# Patient Record
Sex: Female | Born: 1995 | Race: Black or African American | Hispanic: No | Marital: Single | Smoking: Former smoker
Health system: Southern US, Community
[De-identification: ages and names within clinical notes are randomized; demographics above are authoritative.]

## PROBLEM LIST (undated history)

## (undated) DIAGNOSIS — H539 Unspecified visual disturbance: Secondary | ICD-10-CM

## (undated) DIAGNOSIS — F32A Depression, unspecified: Secondary | ICD-10-CM

## (undated) DIAGNOSIS — H6093 Unspecified otitis externa, bilateral: Secondary | ICD-10-CM

## (undated) DIAGNOSIS — F329 Major depressive disorder, single episode, unspecified: Secondary | ICD-10-CM

## (undated) DIAGNOSIS — A749 Chlamydial infection, unspecified: Secondary | ICD-10-CM

## (undated) DIAGNOSIS — J353 Hypertrophy of tonsils with hypertrophy of adenoids: Secondary | ICD-10-CM

## (undated) DIAGNOSIS — F909 Attention-deficit hyperactivity disorder, unspecified type: Secondary | ICD-10-CM

## (undated) DIAGNOSIS — E669 Obesity, unspecified: Secondary | ICD-10-CM

## (undated) DIAGNOSIS — R0683 Snoring: Secondary | ICD-10-CM

## (undated) HISTORY — PX: EYE SURGERY: SHX253

## (undated) HISTORY — PX: ORTHOPEDIC SURGERY: SHX850

## (undated) HISTORY — PX: OTHER SURGICAL HISTORY: SHX169

---
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
## 2016-01-06 ENCOUNTER — Telehealth: Payer: Self-pay | Admitting: Nurse Practitioner

## 2016-01-06 DIAGNOSIS — F988 Other specified behavioral and emotional disorders with onset usually occurring in childhood and adolescence: Secondary | ICD-10-CM

## 2016-01-06 MED ORDER — LISDEXAMFETAMINE DIMESYLATE 60 MG PO CAPS: 60.0000 mg | ORAL_CAPSULE | ORAL | Status: DC

## 2016-01-06 NOTE — Telephone Encounter (Signed)
vyvanse rx ready for pick up  

## 2016-01-06 NOTE — Telephone Encounter (Signed)
Patient aware rx ready to be picked up 

## 2016-02-05 ENCOUNTER — Other Ambulatory Visit: Payer: Self-pay | Admitting: Nurse Practitioner

## 2016-02-05 DIAGNOSIS — F988 Other specified behavioral and emotional disorders with onset usually occurring in childhood and adolescence: Secondary | ICD-10-CM

## 2016-02-06 NOTE — Telephone Encounter (Signed)
Last filled 01/06/16, last seen 10/03/15.Rx will print

## 2016-02-06 NOTE — Telephone Encounter (Signed)
Patient NTBS for follow up and lab work  

## 2016-02-06 NOTE — Telephone Encounter (Signed)
Notified will NTBS

## 2016-02-26 ENCOUNTER — Ambulatory Visit (INDEPENDENT_AMBULATORY_CARE_PROVIDER_SITE_OTHER): Payer: Medicaid Other | Admitting: Nurse Practitioner

## 2016-02-26 ENCOUNTER — Encounter: Payer: Self-pay | Admitting: Nurse Practitioner

## 2016-02-26 DIAGNOSIS — F900 Attention-deficit hyperactivity disorder, predominantly inattentive type: Secondary | ICD-10-CM

## 2016-02-26 DIAGNOSIS — F988 Other specified behavioral and emotional disorders with onset usually occurring in childhood and adolescence: Secondary | ICD-10-CM

## 2016-02-26 MED ORDER — LISDEXAMFETAMINE DIMESYLATE 60 MG PO CAPS: 60.0000 mg | ORAL_CAPSULE | ORAL | 0 refills | Status: DC

## 2016-02-26 NOTE — Progress Notes (Signed)
   Subjective:    Patient ID: Alexandria Lara, female    DOB: 1996/03/19, 20 y.o.   MRN: [id number]  HPI Patient brought in today by self for follow up of ADD. Currently taking currently on vyvanse 60mg  daily. Behavior- no issues Grades- good at end of school year last year. Is just now starting college at [employer]. Medication side effects- none Weight loss- none Sleeping habits- good Any concerns- none today     Review of Systems  Constitutional: Negative.   HENT: Negative.   Respiratory: Negative.   Cardiovascular: Negative.   Genitourinary: Negative.   Neurological: Negative.   Psychiatric/Behavioral: Negative.   All other systems reviewed and are negative.      Objective:   Physical Exam  Constitutional: She is oriented to person, place, and time. She appears well-developed and well-nourished.  Cardiovascular: Normal rate, regular rhythm and normal heart sounds.   Musculoskeletal: Normal range of motion.  Neurological: She is alert and oriented to person, place, and time.  Skin: Skin is warm.  Psychiatric: She has a normal mood and affect. Her behavior is normal. Judgment and thought content normal.    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 lisdexamfetamine (VYVANSE) 60 MG capsule    Sig: Take 1 capsule (60 mg total) by mouth every morning.    Dispense:  30 capsule    Refill:  0    DO NOT FILL TILL 03/27/16    Order Specific Question:   Supervising Provider    Answer:   Rex KrasVINCENT, CAROL L [[id number]]  . lisdexamfetamine (VYVANSE) 60 MG capsule    Sig: Take 1 capsule (60 mg total) by mouth every morning.    Dispense:  30 capsule    Refill:  0    DO NOT FILL TILL 04/25/16    Order Specific Question:   Supervising Provider    Answer:   Rex KrasVINCENT, CAROL L [[id number]]  .  lisdexamfetamine (VYVANSE) 60 MG capsule    Sig: Take 1 capsule (60 mg total) by mouth every morning.    Dispense:  30 capsule    Refill:  0    Order Specific Question:   Supervising Provider    Answer:   Ala BentVINCENT, CAROL L [[id number]]   Stress management encouraged Follow up in 3 months  Mary-Margaret Daphine DeutscherMartin, FNP

## 2016-02-26 NOTE — Patient Instructions (Signed)
Stress and Stress Management Stress is a normal reaction to life events. It is what you feel when life demands more than you are used to or more than you can handle. Some stress can be useful. For example, the stress reaction can help you catch the last bus of the day, study for a test, or meet a deadline at work. But stress that occurs too often or for too long can cause problems. It can affect your emotional health and interfere with relationships and normal daily activities. Too much stress can weaken your immune system and increase your risk for physical illness. If you already have a medical problem, stress can make it worse. CAUSES  All sorts of life events may cause stress. An event that causes stress for one person may not be stressful for another person. Major life events commonly cause stress. These may be positive or negative. Examples include losing your job, moving into a new home, getting married, having a baby, or losing a loved one. Less obvious life events may also cause stress, especially if they occur day after day or in combination. Examples include working long hours, driving in traffic, caring for children, being in debt, or being in a difficult relationship. SIGNS AND SYMPTOMS Stress may cause emotional symptoms including, the following:  Anxiety. This is feeling worried, afraid, on edge, overwhelmed, or out of control.  Anger. This is feeling irritated or impatient.  Depression. This is feeling sad, down, helpless, or guilty.  Difficulty focusing, remembering, or making decisions. Stress may cause physical symptoms, including the following:   Aches and pains. These may affect your head, neck, back, stomach, or other areas of your body.  Tight muscles or clenched jaw.  Low energy or trouble sleeping. Stress may cause unhealthy behaviors, including the following:   Eating to feel better (overeating) or skipping meals.  Sleeping too little, too much, or both.  Working  too much or putting off tasks (procrastination).  Smoking, drinking alcohol, or using drugs to feel better. DIAGNOSIS  Stress is diagnosed through an assessment by your health care provider. Your health care provider will ask questions about your symptoms and any stressful life events.Your health care provider will also ask about your medical history and may order blood tests or other tests. Certain medical conditions and medicine can cause physical symptoms similar to stress. Mental illness can cause emotional symptoms and unhealthy behaviors similar to stress. Your health care provider may refer you to a mental health professional for further evaluation.  TREATMENT  Stress management is the recommended treatment for stress.The goals of stress management are reducing stressful life events and coping with stress in healthy ways.  Techniques for reducing stressful life events include the following:  Stress identification. Self-monitor for stress and identify what causes stress for you. These skills may help you to avoid some stressful events.  Time management. Set your priorities, keep a calendar of events, and learn to say "no." These tools can help you avoid making too many commitments. Techniques for coping with stress include the following:  Rethinking the problem. Try to think realistically about stressful events rather than ignoring them or overreacting. Try to find the positives in a stressful situation rather than focusing on the negatives.  Exercise. Physical exercise can release both physical and emotional tension. The key is to find a form of exercise you enjoy and do it regularly.  Relaxation techniques. These relax the body and mind. Examples include yoga, meditation, tai chi, biofeedback, deep  breathing, progressive muscle relaxation, listening to music, being out in nature, journaling, and other hobbies. Again, the key is to find one or more that you enjoy and can do  regularly.  Healthy lifestyle. Eat a balanced diet, get plenty of sleep, and do not smoke. Avoid using alcohol or drugs to relax.  Strong support network. Spend time with family, friends, or other people you enjoy being around.Express your feelings and talk things over with someone you trust. Counseling or talktherapy with a mental health professional may be helpful if you are having difficulty managing stress on your own. Medicine is typically not recommended for the treatment of stress.Talk to your health care provider if you think you need medicine for symptoms of stress. HOME CARE INSTRUCTIONS  Keep all follow-up visits as directed by your health care provider.  Take all medicines as directed by your health care provider. SEEK MEDICAL CARE IF:  Your symptoms get worse or you start having new symptoms.  You feel overwhelmed by your problems and can no longer manage them on your own. SEEK IMMEDIATE MEDICAL CARE IF:  You feel like hurting yourself or someone else.   This information is not intended to replace advice given to you by your health care provider. Make sure you discuss any questions you have with your health care provider.   Document Released: 12/08/2000 Document Revised: 07/05/2014 Document Reviewed: 02/06/2013 Elsevier Interactive Patient Education 2016 Elsevier Inc.  

## 2016-04-28 ENCOUNTER — Ambulatory Visit: Payer: Medicaid Other | Admitting: Family Medicine

## 2016-04-28 ENCOUNTER — Ambulatory Visit: Payer: Medicaid Other | Admitting: Pediatrics

## 2016-04-29 ENCOUNTER — Ambulatory Visit (INDEPENDENT_AMBULATORY_CARE_PROVIDER_SITE_OTHER): Payer: Medicaid Other | Admitting: Family

## 2016-04-29 ENCOUNTER — Encounter: Payer: Self-pay | Admitting: Family

## 2016-04-29 VITALS — BP 138/103 | HR 105 | Temp 98.2°F | Ht 62.0 in | Wt 285.6 lb

## 2016-04-29 DIAGNOSIS — J069 Acute upper respiratory infection, unspecified: Secondary | ICD-10-CM | POA: Diagnosis not present

## 2016-04-29 DIAGNOSIS — F17200 Nicotine dependence, unspecified, uncomplicated: Secondary | ICD-10-CM

## 2016-04-29 MED ORDER — FLUTICASONE PROPIONATE 50 MCG/ACT NA SUSP: 2.0000 | Freq: Every day | NASAL | 6 refills | Status: DC

## 2016-04-29 MED ORDER — BENZONATATE 200 MG PO CAPS: 200.0000 mg | ORAL_CAPSULE | Freq: Three times a day (TID) | ORAL | 1 refills | Status: DC | PRN

## 2016-04-29 NOTE — Progress Notes (Signed)
   Subjective:    Patient ID: Alexandria Lara, female    DOB: 10-Nov-1995, 20 y.o.   MRN: [id number]  Cough  This is a new problem. The current episode started in the past 7 days. The problem has been gradually worsening. The problem occurs every few minutes. The cough is productive of sputum. Associated symptoms include chills, ear pain (right), headaches, myalgias, nasal congestion, postnasal drip, rhinorrhea, a sore throat, shortness of breath and wheezing. Pertinent negatives include no fever. The symptoms are aggravated by lying down. She has tried rest and OTC cough suppressant for the symptoms. The treatment provided mild relief. There is no history of asthma or COPD.      Review of Systems  Constitutional: Positive for chills. Negative for fever.  HENT: Positive for ear pain (right), postnasal drip, rhinorrhea and sore throat.   Respiratory: Positive for cough, shortness of breath and wheezing.   Musculoskeletal: Positive for myalgias.  Neurological: Positive for headaches.  All other systems reviewed and are negative.      Objective:   Physical Exam  Constitutional: She is oriented to person, place, and time. She appears well-developed and well-nourished. No distress.  HENT:  Head: Normocephalic and atraumatic.  Right Ear: External ear normal. Tympanic membrane is erythematous.  Left Ear: External ear normal.  Nose: Mucosal edema and rhinorrhea present.  Mouth/Throat: Posterior oropharyngeal erythema present.  Eyes: Pupils are equal, round, and reactive to light.  Neck: Normal range of motion. Neck supple. No thyromegaly present.  Cardiovascular: Normal rate, regular rhythm, normal heart sounds and intact distal pulses.   No murmur heard. Pulmonary/Chest: Effort normal and breath sounds normal. No respiratory distress. She has no wheezes.  Abdominal: Soft. Bowel sounds are normal. She exhibits no distension. There is no tenderness.  Musculoskeletal: Normal range of motion. She  exhibits no edema or tenderness.  Neurological: She is alert and oriented to person, place, and time. She has normal reflexes. No cranial nerve deficit.  Skin: Skin is warm and dry.  Psychiatric: She has a normal mood and affect. Her behavior is normal. Judgment and thought content normal.  Vitals reviewed.     BP (!) 138/103   Pulse (!) 105   Temp 98.2 F (36.8 C) (Oral)   Ht 5\' 2"  (1.575 m)   Wt 285 lb 9.6 oz (129.5 kg)   BMI 52.24 kg/m      Assessment & Plan:  1. Current smoker  2. Acute upper respiratory infection -- Take meds as prescribed - Use a cool mist humidifier  -Use saline nose sprays frequently -Saline irrigations of the nose can be very helpful if done frequently.  * 4X daily for 1 week*  * Use of a nettie pot can be helpful with this. Follow directions with this* -Force fluids -For any cough or congestion  Use plain Mucinex- regular strength or max strength is fine   * Children- consult with Pharmacist for dosing -For fever or aces or pains- take tylenol or ibuprofen appropriate for age and weight.  * for fevers greater than 101 orally you may alternate ibuprofen and tylenol every  3 hours. -Throat lozenges if help - fluticasone (FLONASE) 50 MCG/ACT nasal spray; Place 2 sprays into both nostrils daily.  Dispense: 16 g; Refill: 6 - benzonatate (TESSALON) 200 MG capsule; Take 1 capsule (200 mg total) by mouth 3 (three) times daily as needed.  Dispense: 30 capsule; Refill: 1  Jannifer Rodneyhristy Hawks, FNP

## 2016-04-29 NOTE — Patient Instructions (Signed)
Upper Respiratory Infection, Adult Most upper respiratory infections (URIs) are a viral infection of the air passages leading to the lungs. A URI affects the nose, throat, and upper air passages. The most common type of URI is nasopharyngitis and is typically referred to as "the common cold." URIs run their course and usually go away on their own. Most of the time, a URI does not require medical attention, but sometimes a bacterial infection in the upper airways can follow a viral infection. This is called a secondary infection. Sinus and middle ear infections are common types of secondary upper respiratory infections. Bacterial pneumonia can also complicate a URI. A URI can worsen asthma and chronic obstructive pulmonary disease (COPD). Sometimes, these complications can require emergency medical care and may be life threatening.  CAUSES Almost all URIs are caused by viruses. A virus is a type of germ and can spread from one person to another.  RISKS FACTORS You may be at risk for a URI if:   You smoke.   You have chronic heart or lung disease.  You have a weakened defense (immune) system.   You are very young or very old.   You have nasal allergies or asthma.  You work in crowded or poorly ventilated areas.  You work in health care facilities or schools. SIGNS AND SYMPTOMS  Symptoms typically develop 2-3 days after you come in contact with a cold virus. Most viral URIs last 7-10 days. However, viral URIs from the influenza virus (flu virus) can last 14-18 days and are typically more severe. Symptoms may include:   Runny or stuffy (congested) nose.   Sneezing.   Cough.   Sore throat.   Headache.   Fatigue.   Fever.   Loss of appetite.   Pain in your forehead, behind your eyes, and over your cheekbones (sinus pain).  Muscle aches.  DIAGNOSIS  Your health care provider may diagnose a URI by:  Physical exam.  Tests to check that your symptoms are not due to  another condition such as:  Strep throat.  Sinusitis.  Pneumonia.  Asthma. TREATMENT  A URI goes away on its own with time. It cannot be cured with medicines, but medicines may be prescribed or recommended to relieve symptoms. Medicines may help:  Reduce your fever.  Reduce your cough.  Relieve nasal congestion. HOME CARE INSTRUCTIONS   Take medicines only as directed by your health care provider.   Gargle warm saltwater or take cough drops to comfort your throat as directed by your health care provider.  Use a warm mist humidifier or inhale steam from a shower to increase air moisture. This may make it easier to breathe.  Drink enough fluid to keep your urine clear or pale yellow.   Eat soups and other clear broths and maintain good nutrition.   Rest as needed.   Return to work when your temperature has returned to normal or as your health care provider advises. You may need to stay home longer to avoid infecting others. You can also use a face mask and careful hand washing to prevent spread of the virus.  Increase the usage of your inhaler if you have asthma.   Do not use any tobacco products, including cigarettes, chewing tobacco, or electronic cigarettes. If you need help quitting, ask your health care provider. PREVENTION  The best way to protect yourself from getting a cold is to practice good hygiene.   Avoid oral or hand contact with people with cold   symptoms.   Wash your hands often if contact occurs.  There is no clear evidence that vitamin C, vitamin E, echinacea, or exercise reduces the chance of developing a cold. However, it is always recommended to get plenty of rest, exercise, and practice good nutrition.  SEEK MEDICAL CARE IF:   You are getting worse rather than better.   Your symptoms are not controlled by medicine.   You have chills.  You have worsening shortness of breath.  You have brown or red mucus.  You have yellow or brown nasal  discharge.  You have pain in your face, especially when you bend forward.  You have a fever.  You have swollen neck glands.  You have pain while swallowing.  You have white areas in the back of your throat. SEEK IMMEDIATE MEDICAL CARE IF:   You have severe or persistent:  Headache.  Ear pain.  Sinus pain.  Chest pain.  You have chronic lung disease and any of the following:  Wheezing.  Prolonged cough.  Coughing up blood.  A change in your usual mucus.  You have a stiff neck.  You have changes in your:  Vision.  Hearing.  Thinking.  Mood. MAKE SURE YOU:   Understand these instructions.  Will watch your condition.  Will get help right away if you are not doing well or get worse.   This information is not intended to replace advice given to you by your health care provider. Make sure you discuss any questions you have with your health care provider.   Document Released: 12/08/2000 Document Revised: 10/29/2014 Document Reviewed: 09/19/2013 Elsevier Interactive Patient Education 2016 Elsevier Inc.  

## 2016-07-05 ENCOUNTER — Ambulatory Visit: Payer: Medicaid Other | Admitting: Nurse Practitioner

## 2016-07-06 ENCOUNTER — Encounter: Payer: Self-pay | Admitting: Nurse Practitioner

## 2016-10-25 ENCOUNTER — Ambulatory Visit: Payer: Medicaid Other | Admitting: Nurse Practitioner

## 2016-10-26 ENCOUNTER — Encounter: Payer: Self-pay | Admitting: Nurse Practitioner

## 2016-11-02 ENCOUNTER — Encounter: Payer: Self-pay | Admitting: Nurse Practitioner

## 2016-11-02 ENCOUNTER — Ambulatory Visit (INDEPENDENT_AMBULATORY_CARE_PROVIDER_SITE_OTHER): Payer: Self-pay | Admitting: Nurse Practitioner

## 2016-11-02 DIAGNOSIS — F988 Other specified behavioral and emotional disorders with onset usually occurring in childhood and adolescence: Secondary | ICD-10-CM

## 2016-11-02 MED ORDER — LISDEXAMFETAMINE DIMESYLATE 60 MG PO CAPS: 60.0000 mg | ORAL_CAPSULE | ORAL | 0 refills | Status: DC

## 2016-11-02 NOTE — Progress Notes (Signed)
   Subjective:    Patient ID: Alexandria Lara, female    DOB: [birth year], 21 y.o.   MRN: [id number]  HPI Patient in today for follow up of adult adhd. SHe has had Adhd for many years- she was on vyvanse 60mg  daily- has not taken since he graduated frm high school last June. [facility] [employer] in August. [facility]. She is having lots of prolems concentrating and cannot focus on anything.   Review of Systems  Constitutional: Negative.   Respiratory: Negative.   Cardiovascular: Negative.   Neurological: Negative.   Psychiatric/Behavioral: Negative.   All other systems reviewed and are negative.      Objective:   Physical Exam  Constitutional: She is oriented to person, place, and time. She appears well-developed and well-nourished. No distress.  Cardiovascular: Normal rate and regular rhythm.   Pulmonary/Chest: Effort normal.  Neurological: She is alert and oriented to person, place, and time.  Skin: Skin is warm.  Psychiatric: She has a normal mood and affect. Her behavior is normal. Judgment and thought content normal.   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 lisdexamfetamine (VYVANSE) 60 MG capsule    Sig: Take 1 capsule (60 mg total) by mouth every morning.    Dispense:  30 capsule    Refill:  0    Order Specific Question:   Supervising Provider    Answer:   VINCENT, CAROL L [[id number]]  . lisdexamfetamine (VYVANSE) 60 MG capsule    Sig: Take 1 capsule (60 mg total) by mouth every morning.    Dispense:  30 capsule    Refill:  0    DO NOT FILL TILL 12/02/16    Order Specific Question:   Supervising Provider    Answer:   Rex KrasVINCENT, CAROL L [[id number]]  . lisdexamfetamine (VYVANSE) 60 MG capsule    Sig: Take 1 capsule (60 mg total) by mouth  every morning.    Dispense:  30 capsule    Refill:  0    DO NOT FILL TILL 12/31/16    Order Specific Question:   Supervising Provider    Answer:   Johna SheriffVINCENT, CAROL L [[id number]]   Stress management Follow up in 3 months  Mary-Margaret Daphine DeutscherMartin, FNP

## 2018-11-22 ENCOUNTER — Emergency Department (HOSPITAL_COMMUNITY): Payer: Self-pay | Admitting: Anesthesiology

## 2018-11-22 ENCOUNTER — Encounter (HOSPITAL_COMMUNITY): Payer: Self-pay | Admitting: Emergency Medicine

## 2018-11-22 ENCOUNTER — Ambulatory Visit: Payer: Self-pay | Admitting: Ophthalmology

## 2018-11-22 ENCOUNTER — Ambulatory Visit (HOSPITAL_COMMUNITY)
Admission: EM | Admit: 2018-11-22 | Discharge: 2018-11-22 | Disposition: A | Discharge status: Home / Self Care | Payer: Self-pay | Attending: Emergency Medicine | Admitting: Emergency Medicine

## 2018-11-22 ENCOUNTER — Emergency Department (HOSPITAL_COMMUNITY): Payer: Medicaid Other

## 2018-11-22 ENCOUNTER — Emergency Department (HOSPITAL_COMMUNITY): Payer: Self-pay

## 2018-11-22 ENCOUNTER — Encounter (HOSPITAL_COMMUNITY)
Admission: EM | Disposition: A | Discharge status: Home / Self Care | Payer: Self-pay | Source: Home / Self Care | Attending: Emergency Medicine

## 2018-11-22 ENCOUNTER — Other Ambulatory Visit: Payer: Self-pay

## 2018-11-22 DIAGNOSIS — Z1159 Encounter for screening for other viral diseases: Secondary | ICD-10-CM | POA: Insufficient documentation

## 2018-11-22 DIAGNOSIS — Z419 Encounter for procedure for purposes other than remedying health state, unspecified: Secondary | ICD-10-CM

## 2018-11-22 DIAGNOSIS — S0512XA Contusion of eyeball and orbital tissues, left eye, initial encounter: Secondary | ICD-10-CM | POA: Insufficient documentation

## 2018-11-22 DIAGNOSIS — Z6841 Body Mass Index (BMI) 40.0 and over, adult: Secondary | ICD-10-CM | POA: Insufficient documentation

## 2018-11-22 DIAGNOSIS — S0532XA Ocular laceration without prolapse or loss of intraocular tissue, left eye, initial encounter: Secondary | ICD-10-CM | POA: Insufficient documentation

## 2018-11-22 DIAGNOSIS — F909 Attention-deficit hyperactivity disorder, unspecified type: Secondary | ICD-10-CM | POA: Insufficient documentation

## 2018-11-22 DIAGNOSIS — F17200 Nicotine dependence, unspecified, uncomplicated: Secondary | ICD-10-CM | POA: Insufficient documentation

## 2018-11-22 DIAGNOSIS — W109XXA Fall (on) (from) unspecified stairs and steps, initial encounter: Secondary | ICD-10-CM | POA: Insufficient documentation

## 2018-11-22 HISTORY — PX: RUPTURED GLOBE EXPLORATION AND REPAIR: SHX2366

## 2018-11-22 LAB — CBC
HCT: 44.2 % (ref 36.0–46.0)
Hemoglobin: 14.9 g/dL (ref 12.0–15.0)
MCH: 29.9 pg (ref 26.0–34.0)
MCHC: 33.7 g/dL (ref 30.0–36.0)
MCV: 88.8 fL (ref 80.0–100.0)
Platelets: 264 10*3/uL (ref 150–400)
RBC: 4.98 MIL/uL (ref 3.87–5.11)
RDW: 13.7 % (ref 11.5–15.5)
WBC: 7.5 10*3/uL (ref 4.0–10.5)
nRBC: 0 % (ref 0.0–0.2)

## 2018-11-22 LAB — BASIC METABOLIC PANEL
Anion gap: 13 (ref 5–15)
BUN: 15 mg/dL (ref 6–20)
CO2: 21 mmol/L — ABNORMAL LOW (ref 22–32)
Calcium: 9.5 mg/dL (ref 8.9–10.3)
Chloride: 103 mmol/L (ref 98–111)
Creatinine, Ser: 0.91 mg/dL (ref 0.44–1.00)
GFR calc Af Amer: >60 mL/min (ref >60)
GFR calc non Af Amer: >60 mL/min (ref >60)
Glucose, Bld: 104 mg/dL — ABNORMAL HIGH (ref 70–99)
Potassium: 3.8 mmol/L (ref 3.5–5.1)
Sodium: 137 mmol/L (ref 135–145)

## 2018-11-22 LAB — SARS CORONAVIRUS 2 BY RT PCR (HOSPITAL ORDER, PERFORMED IN ~~LOC~~ HOSPITAL LAB): SARS Coronavirus 2: NEGATIVE

## 2018-11-22 LAB — I-STAT BETA HCG BLOOD, ED (MC, WL, AP ONLY): I-stat hCG, quantitative: <5 m[IU]/mL (ref <5)

## 2018-11-22 SURGERY — REPAIR, RUPTURE, GLOBE
Anesthesia: General | Site: Eye | Laterality: Left

## 2018-11-22 MED ORDER — ARTIFICIAL TEARS OPHTHALMIC OINT
TOPICAL_OINTMENT | OPHTHALMIC | Status: AC
  Filled 2018-11-22: qty 3.5

## 2018-11-22 MED ORDER — MIDAZOLAM HCL 2 MG/2ML IJ SOLN
INTRAMUSCULAR | Status: AC
  Filled 2018-11-22: qty 2

## 2018-11-22 MED ORDER — PROPOFOL 10 MG/ML IV BOLUS
INTRAVENOUS | Status: AC
  Filled 2018-11-22: qty 40

## 2018-11-22 MED ORDER — BUPIVACAINE HCL (PF) 0.75 % IJ SOLN
INTRAMUSCULAR | Status: DC | PRN
  Administered 2018-11-22: 10 mL

## 2018-11-22 MED ORDER — LIDOCAINE HCL (CARDIAC) PF 100 MG/5ML IV SOSY
PREFILLED_SYRINGE | INTRAVENOUS | Status: DC | PRN
  Administered 2018-11-22: 60 mg via INTRATRACHEAL

## 2018-11-22 MED ORDER — HYALURONIDASE HUMAN 150 UNIT/ML IJ SOLN
INTRAMUSCULAR | Status: AC
  Filled 2018-11-22: qty 1

## 2018-11-22 MED ORDER — LIDOCAINE 2% (20 MG/ML) 5 ML SYRINGE
INTRAMUSCULAR | Status: AC
  Filled 2018-11-22: qty 5

## 2018-11-22 MED ORDER — HYDROMORPHONE HCL 1 MG/ML IJ SOLN
1.0000 mg | Freq: Once | INTRAMUSCULAR | Status: AC
  Administered 2018-11-22: 1 mg via INTRAVENOUS
  Filled 2018-11-22: qty 1

## 2018-11-22 MED ORDER — HYDROMORPHONE HCL 1 MG/ML IJ SOLN: 0.5000 mg | INTRAMUSCULAR | Status: DC | PRN

## 2018-11-22 MED ORDER — VANCOMYCIN HCL 10 G IV SOLR
2000.0000 mg | Freq: Once | INTRAVENOUS | Status: AC
  Administered 2018-11-22: 2000 mg via INTRAVENOUS
  Filled 2018-11-22: qty 2000

## 2018-11-22 MED ORDER — EPINEPHRINE PF 1 MG/ML IJ SOLN
INTRAMUSCULAR | Status: AC
  Filled 2018-11-22: qty 1

## 2018-11-22 MED ORDER — SODIUM HYALURONATE 10 MG/ML IO SOLN
INTRAOCULAR | Status: AC
  Filled 2018-11-22: qty 0.85

## 2018-11-22 MED ORDER — SUGAMMADEX SODIUM 200 MG/2ML IV SOLN
INTRAVENOUS | Status: DC | PRN
  Administered 2018-11-22: 200 mg via INTRAVENOUS

## 2018-11-22 MED ORDER — ROCURONIUM BROMIDE 10 MG/ML (PF) SYRINGE
PREFILLED_SYRINGE | INTRAVENOUS | Status: AC
  Filled 2018-11-22: qty 10

## 2018-11-22 MED ORDER — NEOMYCIN-POLYMYXIN-DEXAMETH 3.5-10000-0.1 OP OINT
TOPICAL_OINTMENT | OPHTHALMIC | Status: AC
  Filled 2018-11-22: qty 3.5

## 2018-11-22 MED ORDER — FENTANYL CITRATE (PF) 250 MCG/5ML IJ SOLN
INTRAMUSCULAR | Status: AC
  Filled 2018-11-22: qty 5

## 2018-11-22 MED ORDER — BACITRACIN-POLYMYXIN B 500-10000 UNIT/GM OP OINT
TOPICAL_OINTMENT | OPHTHALMIC | Status: AC
  Filled 2018-11-22: qty 3.5

## 2018-11-22 MED ORDER — BSS IO SOLN
INTRAOCULAR | Status: AC
  Filled 2018-11-22: qty 30

## 2018-11-22 MED ORDER — TETANUS-DIPHTH-ACELL PERTUSSIS 5-2.5-18.5 LF-MCG/0.5 IM SUSP
0.5000 mL | Freq: Once | INTRAMUSCULAR | Status: AC
  Administered 2018-11-22: 03:00:00 0.5 mL via INTRAMUSCULAR
  Filled 2018-11-22: qty 0.5

## 2018-11-22 MED ORDER — ONDANSETRON HCL 4 MG/2ML IJ SOLN
4.0000 mg | Freq: Once | INTRAMUSCULAR | Status: AC
  Administered 2018-11-22: 4 mg via INTRAVENOUS
  Filled 2018-11-22: qty 2

## 2018-11-22 MED ORDER — PHENYLEPHRINE 40 MCG/ML (10ML) SYRINGE FOR IV PUSH (FOR BLOOD PRESSURE SUPPORT)
PREFILLED_SYRINGE | INTRAVENOUS | Status: AC
  Filled 2018-11-22: qty 10

## 2018-11-22 MED ORDER — VANCOMYCIN HCL 1000 MG IV SOLR
INTRAVENOUS | Status: DC | PRN
  Administered 2018-11-22: 1000 mg via INTRAVENOUS

## 2018-11-22 MED ORDER — PROPOFOL 10 MG/ML IV BOLUS
INTRAVENOUS | Status: DC | PRN
  Administered 2018-11-22: 240 mg via INTRAVENOUS

## 2018-11-22 MED ORDER — CEFTAZIDIME INTRAVITREAL INJECTION 2.25 MG/0.1 ML
INTRAVITREAL | Status: DC | PRN
  Administered 2018-11-22: 11.25 mg via INTRAVITREAL

## 2018-11-22 MED ORDER — BUPIVACAINE HCL (PF) 0.75 % IJ SOLN
INTRAMUSCULAR | Status: AC
  Filled 2018-11-22: qty 10

## 2018-11-22 MED ORDER — GENTAMICIN FORTIFIED OPHTHALMIC SOLUTION
2.0000 [drp] | OPHTHALMIC | Status: DC
  Filled 2018-11-22 (×2): qty 7

## 2018-11-22 MED ORDER — ROCURONIUM BROMIDE 100 MG/10ML IV SOLN
INTRAVENOUS | Status: DC | PRN
  Administered 2018-11-22: 10 mg via INTRAVENOUS
  Administered 2018-11-22: 100 mg via INTRAVENOUS

## 2018-11-22 MED ORDER — ATROPINE SULFATE 1 % OP SOLN
OPHTHALMIC | Status: AC
  Filled 2018-11-22: qty 5

## 2018-11-22 MED ORDER — BSS IO SOLN
INTRAOCULAR | Status: AC
  Filled 2018-11-22: qty 15

## 2018-11-22 MED ORDER — LEVOFLOXACIN IN D5W 500 MG/100ML IV SOLN
500.0000 mg | INTRAVENOUS | Status: DC
  Administered 2018-11-22: 500 mg via INTRAVENOUS
  Filled 2018-11-22: qty 100

## 2018-11-22 MED ORDER — MIDAZOLAM HCL 5 MG/5ML IJ SOLN
INTRAMUSCULAR | Status: DC | PRN
  Administered 2018-11-22: 2 mg via INTRAVENOUS

## 2018-11-22 MED ORDER — FENTANYL CITRATE (PF) 100 MCG/2ML IJ SOLN: 25.0000 ug | INTRAMUSCULAR | Status: DC | PRN

## 2018-11-22 MED ORDER — DEXAMETHASONE SODIUM PHOSPHATE 10 MG/ML IJ SOLN
INTRAMUSCULAR | Status: AC
  Filled 2018-11-22: qty 1

## 2018-11-22 MED ORDER — FENTANYL CITRATE (PF) 250 MCG/5ML IJ SOLN
INTRAMUSCULAR | Status: DC | PRN
  Administered 2018-11-22 (×4): 50 ug via INTRAVENOUS

## 2018-11-22 MED ORDER — DEXAMETHASONE SODIUM PHOSPHATE 10 MG/ML IJ SOLN
INTRAMUSCULAR | Status: DC | PRN
  Administered 2018-11-22: 10 mg via INTRAVENOUS

## 2018-11-22 MED ORDER — LIDOCAINE HCL 2 % IJ SOLN
INTRAMUSCULAR | Status: AC
  Filled 2018-11-22: qty 20

## 2018-11-22 MED ORDER — ONDANSETRON HCL 4 MG/2ML IJ SOLN
INTRAMUSCULAR | Status: DC | PRN
  Administered 2018-11-22: 4 mg via INTRAVENOUS

## 2018-11-22 MED ORDER — CEFTAZIDIME INTRAVITREAL INJECTION 2.25 MG/0.1 ML
2.2500 mg | INTRAVITREAL | Status: DC
  Filled 2018-11-22: qty 0.1

## 2018-11-22 MED ORDER — HYPROMELLOSE (GONIOSCOPIC) 2.5 % OP SOLN
OPHTHALMIC | Status: AC
  Filled 2018-11-22: qty 15

## 2018-11-22 MED ORDER — TETRACAINE HCL 0.5 % OP SOLN
OPHTHALMIC | Status: AC
  Filled 2018-11-22: qty 4

## 2018-11-22 MED ORDER — LEVOFLOXACIN IN D5W 750 MG/150ML IV SOLN
750.0000 mg | INTRAVENOUS | Status: DC
  Filled 2018-11-22: qty 150

## 2018-11-22 MED ORDER — PROMETHAZINE HCL 25 MG/ML IJ SOLN: 6.2500 mg | INTRAMUSCULAR | Status: DC | PRN

## 2018-11-22 MED ORDER — VANCOMYCIN INTRAVITREAL INJECTION 1 MG/0.1 ML
INTRAOCULAR | Status: DC | PRN
  Administered 2018-11-22: 5 mg via INTRAVITREAL

## 2018-11-22 MED ORDER — INDOCYANINE GREEN 25 MG IV SOLR
INTRAVENOUS | Status: AC
  Filled 2018-11-22: qty 25

## 2018-11-22 MED ORDER — SODIUM CHLORIDE 0.9 % IV SOLN
1.0000 g | Freq: Once | INTRAVENOUS | Status: AC
  Administered 2018-11-22: 1 g via INTRAVENOUS
  Filled 2018-11-22: qty 1

## 2018-11-22 MED ORDER — SODIUM CHLORIDE 0.9 % IV SOLN
INTRAVENOUS | Status: DC | PRN
  Administered 2018-11-22 (×2): via INTRAVENOUS

## 2018-11-22 MED ORDER — BSS PLUS IO SOLN
INTRAOCULAR | Status: AC
  Filled 2018-11-22: qty 500

## 2018-11-22 MED ORDER — ONDANSETRON HCL 4 MG/2ML IJ SOLN
INTRAMUSCULAR | Status: AC
  Filled 2018-11-22: qty 2

## 2018-11-22 MED ORDER — EPHEDRINE 5 MG/ML INJ
INTRAVENOUS | Status: AC
  Filled 2018-11-22: qty 10

## 2018-11-22 MED ORDER — VANCOMYCIN INTRAVITREAL INJECTION 1 MG/0.1 ML
1.0000 mg | INTRAOCULAR | Status: DC
  Filled 2018-11-22: qty 0.1

## 2018-11-22 MED ORDER — TOBRAMYCIN-DEXAMETHASONE 0.3-0.1 % OP OINT
TOPICAL_OINTMENT | OPHTHALMIC | Status: AC
  Filled 2018-11-22: qty 3.5

## 2018-11-22 MED ORDER — PHENYLEPHRINE 40 MCG/ML (10ML) SYRINGE FOR IV PUSH (FOR BLOOD PRESSURE SUPPORT)
PREFILLED_SYRINGE | INTRAVENOUS | Status: DC | PRN
  Administered 2018-11-22 (×3): 40 ug via INTRAVENOUS
  Administered 2018-11-22: 80 ug via INTRAVENOUS
  Administered 2018-11-22 (×3): 40 ug via INTRAVENOUS

## 2018-11-22 MED ORDER — BSS IO SOLN
INTRAOCULAR | Status: DC | PRN
  Administered 2018-11-22 (×2): 15 mL via INTRAOCULAR

## 2018-11-22 MED ORDER — GENTAMICIN SULFATE 40 MG/ML IJ SOLN
INTRAMUSCULAR | Status: AC
  Filled 2018-11-22: qty 2

## 2018-11-22 SURGICAL SUPPLY — 30 items
APPLICATOR DR MATTHEWS STRL (MISCELLANEOUS) IMPLANT
BAG ISOLATION DRAPE 18X18 (DRAPES) IMPLANT
BLADE EYE CATARACT 19 1.4 BEAV (BLADE) IMPLANT
BLADE MVR KNIFE 20G (BLADE) IMPLANT
CAUTERY EYE LOW TEMP 1300F FIN (OPHTHALMIC RELATED) IMPLANT
CORDS BIPOLAR (ELECTRODE) ×3 IMPLANT
COVER SURGICAL LIGHT HANDLE (MISCELLANEOUS) ×3 IMPLANT
COVER WAND RF STERILE (DRAPES) ×3 IMPLANT
DRAPE ISOLATION BAG 18X18 (DRAPES)
DRAPE OPHTHALMIC 40X48 W POUCH (DRAPES) ×3 IMPLANT
ERASER HMR WETFIELD 23G BP (MISCELLANEOUS) IMPLANT
GLOVE ECLIPSE 7.5 STRL STRAW (GLOVE) ×3 IMPLANT
GOWN STRL REUS W/ TWL LRG LVL3 (GOWN DISPOSABLE) ×1 IMPLANT
GOWN STRL REUS W/TWL LRG LVL3 (GOWN DISPOSABLE) ×2
KIT BASIN OR (CUSTOM PROCEDURE TRAY) ×3 IMPLANT
LENS BIOM SUPER VIEW SET DISP (OPHTHALMIC RELATED) IMPLANT
NS IRRIG 1000ML POUR BTL (IV SOLUTION) ×3 IMPLANT
PACK CATARACT CUSTOM (CUSTOM PROCEDURE TRAY) ×3 IMPLANT
PAD ARMBOARD 7.5X6 YLW CONV (MISCELLANEOUS) ×6 IMPLANT
ROLLS DENTAL (MISCELLANEOUS) IMPLANT
SOLUTION ANTI FOG 6CC (MISCELLANEOUS) ×3 IMPLANT
SPEAR EYE SURG WECK-CEL (MISCELLANEOUS) IMPLANT
SPECIMEN JAR SMALL (MISCELLANEOUS) IMPLANT
SUT CHROMIC 7 0 TG140 8 (SUTURE) ×3 IMPLANT
SUT ETHILON 10 0 CS140 6 (SUTURE) ×9 IMPLANT
SUT ETHILON 9 0 TG140 8 (SUTURE) ×3 IMPLANT
SUT VICRYL 7 0 TG140 8 (SUTURE) ×3 IMPLANT
SUT VICRYL 8 0 TG140 8 (SUTURE) ×3 IMPLANT
TOWEL OR 17X24 6PK STRL BLUE (TOWEL DISPOSABLE) ×6 IMPLANT
WATER STERILE IRR 1000ML POUR (IV SOLUTION) ×3 IMPLANT

## 2018-11-22 NOTE — Brief Op Note (Signed)
11/22/2018  10:42 AM  PATIENT:  Alexandria Lara  23 y.o. female  PRE-OPERATIVE DIAGNOSIS:  Ruptured globe left eye  POST-OPERATIVE DIAGNOSIS:  Ruptured globe Left eye  PROCEDURE:  Procedure(s): REPAIR OF RUPTURED GLOBE (Left)  SURGEON:  Surgeon(s) and Role:    * Carmela Rima, MD - Primary  PHYSICIAN ASSISTANT:   ASSISTANTS: none   ANESTHESIA:   local and general  EBL:  3 mL   BLOOD ADMINISTERED:none  DRAINS: none   LOCAL MEDICATIONS USED:  LIDOCAINE   SPECIMEN:  No Specimen  DISPOSITION OF SPECIMEN:  N/A  COUNTS:  YES and NO A needle for 10-0 nylon suture was not found in the count, X-ray was ordered.   TOURNIQUET:  * No tourniquets in log *  DICTATION: .Note written in EPIC  PLAN OF CARE: Discharge to home after PACU  PATIENT DISPOSITION:  PACU - hemodynamically stable.   Delay start of Pharmacological VTE agent (>24hrs) due to surgical blood loss or risk of bleeding: not applicable

## 2018-11-22 NOTE — ED Notes (Signed)
OR called, said pt would not go before 5AM

## 2018-11-22 NOTE — ED Notes (Signed)
Pt was on phone w/ her mother, RN gave update and phone number for her to call back.

## 2018-11-22 NOTE — Addendum Note (Signed)
Addendum  created 11/22/18 1152 by Lovie Chol, CRNA   Attestation recorded in [place], Intraprocedure Attestations filed

## 2018-11-22 NOTE — ED Notes (Signed)
Patient transported to CT 

## 2018-11-22 NOTE — ED Provider Notes (Signed)
     Patient seen/examined in the Emergency Department in conjunction with Advanced Practice Provider [facility] Patient presents with left eye injury s/p fall Exam : awake/alert, anxious, photo above.  Likely open globe Plan: will be transferred to OR with Dr. Allena Katz with ophthalmology IV antibiotics ordered     Zadie Rhine, MD 11/22/18 [phone]

## 2018-11-22 NOTE — Anesthesia Procedure Notes (Signed)
Procedure Name: Intubation Date/Time: 11/22/2018 6:36 AM Performed by: Claudina Lick, CRNA Pre-anesthesia Checklist: Patient identified, Emergency Drugs available, Suction available, Patient being monitored and Timeout performed Patient Re-evaluated:Patient Re-evaluated prior to induction Oxygen Delivery Method: Circle system utilized Preoxygenation: Pre-oxygenation with 100% oxygen Induction Type: IV induction, Rapid sequence and Cricoid Pressure applied Laryngoscope Size: Miller and 2 Grade View: Grade I Tube type: Oral Tube size: 7.0 mm Number of attempts: 1 Airway Equipment and Method: Stylet Placement Confirmation: ETT inserted through vocal cords under direct vision,  positive ETCO2 and breath sounds checked- equal and bilateral Secured at: 22 cm Tube secured with: Tape Dental Injury: Teeth and Oropharynx as per pre-operative assessment

## 2018-11-22 NOTE — ED Notes (Signed)
RN contacted Pharmacy, informed them to send drops there as she is going to the OR now.

## 2018-11-22 NOTE — ED Notes (Signed)
Per pharmacy, drops are being made and will be sent to ED.  Drops are to be administered by surgery, not by ED.

## 2018-11-22 NOTE — ED Notes (Signed)
OR is ready for pt

## 2018-11-22 NOTE — Discharge Instructions (Signed)
KEEP PATCH IN PLACE.  THE PATCH WILL BE REMOVED DURING YOUR FOLLOW-UP APPOINTMENT.  INSTRUCTIONS REGARDING THE CARE OF YOUR WOUND WILL BE DISCUSSED DURING YOUR FOLLOW-UP APPOINTMENT.  USE TYLENOL OR IBUPROFEN FOR PAIN FOLLOWING THE GUIDELINES ON THE BOTTLE

## 2018-11-22 NOTE — ED Triage Notes (Signed)
Per EMS, pt from home, stated she was taking the trash out, fell and a stick hit her eye.  Reports she can not see from the eye. Per EMS, bleeding from pupil and cornea.  Denies LOC, neck/back pain

## 2018-11-22 NOTE — Transfer of Care (Signed)
Immediate Anesthesia Transfer of Care Note  Patient: Alexandria Lara  Procedure(s) Performed: REPAIR OF RUPTURED GLOBE (Left Eye)  Patient Location: PACU  Anesthesia Type:General  Level of Consciousness: oriented, drowsy and patient cooperative  Airway & Oxygen Therapy: Patient Spontanous Breathing and Patient connected to nasal cannula oxygen  Post-op Assessment: Report given to RN and Post -op Vital signs reviewed and stable  Post vital signs: Reviewed  Last Vitals:  Vitals Value Taken Time  BP 118/71 11/22/2018  9:25 AM  Temp 36.5 C 11/22/2018  9:25 AM  Pulse 92 11/22/2018  9:30 AM  Resp 17 11/22/2018  9:30 AM  SpO2 88 % 11/22/2018  9:30 AM  Vitals shown include unvalidated device data.  Last Pain:  Vitals:   11/22/18 0925  TempSrc:   PainSc: Asleep         Complications: No apparent anesthesia complications

## 2018-11-22 NOTE — ED Notes (Signed)
Pt going to ct  

## 2018-11-22 NOTE — Anesthesia Postprocedure Evaluation (Signed)
Anesthesia Post Note  Patient: Building control surveyor  Procedure(s) Performed: REPAIR OF RUPTURED GLOBE (Left Eye)     Patient location during evaluation: PACU Anesthesia Type: General Level of consciousness: awake and alert Pain management: pain level controlled Vital Signs Assessment: post-procedure vital signs reviewed and stable Respiratory status: spontaneous breathing, nonlabored ventilation, respiratory function stable and patient connected to nasal cannula oxygen Cardiovascular status: blood pressure returned to baseline and stable Postop Assessment: no apparent nausea or vomiting Anesthetic complications: no    Last Vitals:  Vitals:   11/22/18 1015 11/22/18 1020  BP:  137/81  Pulse: (!) 112 (!) 104  Resp: (!) 24 15  Temp:    SpO2: 100% 100%    Last Pain:  Vitals:   11/22/18 1000  TempSrc:   PainSc: 0-No pain                 Mouna Yager S
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
## 2018-11-22 NOTE — Anesthesia Preprocedure Evaluation (Signed)
Anesthesia Evaluation  Patient identified by MRN, date of birth, ID band Patient awake    Reviewed: Allergy & Precautions, NPO status , Patient's Chart, lab work & pertinent test results  Airway Mallampati: II  TM Distance: >3 FB     Dental  (+) Dental Advisory Given   Pulmonary Current Smoker,    breath sounds clear to auscultation       Cardiovascular negative cardio ROS   Rhythm:Regular Rate:Normal     Neuro/Psych negative neurological ROS     GI/Hepatic negative GI ROS, Neg liver ROS,   Endo/Other  Morbid obesity  Renal/GU negative Renal ROS     Musculoskeletal   Abdominal   Peds  Hematology negative hematology ROS (+)   Anesthesia Other Findings   Reproductive/Obstetrics                             Lab Results  Component Value Date   WBC 7.5 11/22/2018   HGB 14.9 11/22/2018   HCT 44.2 11/22/2018   MCV 88.8 11/22/2018   PLT 264 11/22/2018   Lab Results  Component Value Date   CREATININE 0.91 11/22/2018   BUN 15 11/22/2018   NA 137 11/22/2018   K 3.8 11/22/2018   CL 103 11/22/2018   CO2 21 (L) 11/22/2018    Anesthesia Physical Anesthesia Plan  ASA: III and emergent  Anesthesia Plan: General   Post-op Pain Management:    Induction: Intravenous and Rapid sequence  PONV Risk Score and Plan: 2 and Dexamethasone, Ondansetron and Treatment may vary due to age or medical condition  Airway Management Planned: Oral ETT  Additional Equipment:   Intra-op Plan:   Post-operative Plan: Extubation in OR  Informed Consent: I have reviewed the patients History and Physical, chart, labs and discussed the procedure including the risks, benefits and alternatives for the proposed anesthesia with the patient or authorized representative who has indicated his/her understanding and acceptance.     Dental advisory given  Plan Discussed with: CRNA  Anesthesia Plan Comments:          Anesthesia Quick Evaluation

## 2018-11-22 NOTE — H&P (Signed)
Date of examination:  11/22/18  Indication for surgery: globe rupture left eye  Pertinent past medical history:  Past Medical History:  Diagnosis Date  . Adenotonsillar hypertrophy   . ADHD (attention deficit hyperactivity disorder)    on Vyvanse  . Bilateral external ear infections   . Obesity   . Snores   . Vision abnormalities     Pertinent ocular history:  Fall resulting in globe rupture left eye  Pertinent family history:  Family History  Problem Relation Age of Onset  . ADD / ADHD Father   . COPD Maternal Grandmother   . Heart disease Maternal Grandmother   . Multiple sclerosis Maternal Grandmother     General:  Healthy appearing patient is complains of left eye pain  Eyes:    Acuity  OS BLP  External: left eye periorbital edema  Anterior segment: rfupture, hyphema   No view of posterior pole    Impression: Globe rupture left eye  Plan: Globe rupture repair left eye  Harrold Donath

## 2018-11-23 ENCOUNTER — Encounter (HOSPITAL_COMMUNITY): Payer: Self-pay | Admitting: Ophthalmology

## 2018-12-21 ENCOUNTER — Other Ambulatory Visit (HOSPITAL_COMMUNITY)
Admission: RE | Admit: 2018-12-21 | Discharge: 2018-12-21 | Disposition: A | Discharge status: Home / Self Care | Payer: HRSA Program | Source: Ambulatory Visit | Attending: Ophthalmology | Admitting: Ophthalmology

## 2018-12-21 DIAGNOSIS — Z1159 Encounter for screening for other viral diseases: Secondary | ICD-10-CM | POA: Diagnosis present

## 2018-12-21 LAB — SARS CORONAVIRUS 2 (TAT 6-24 HRS): SARS Coronavirus 2: NEGATIVE

## 2018-12-22 ENCOUNTER — Encounter (HOSPITAL_COMMUNITY): Payer: Self-pay | Admitting: *Deleted

## 2018-12-22 ENCOUNTER — Other Ambulatory Visit: Payer: Self-pay

## 2018-12-22 NOTE — Progress Notes (Signed)
Pt denies SOB, chest pain, and being under the care of a cardiologist. Pt denies having a stress test, echo and cardiac cath. Pt denies having an EKG and chest x ray within the last year. Pt denies recent labs. Pt made aware to stop  taking Aspirin (unless otherwise advised by surgeon), vitamins, fish oil and herbal medications. Do not take any NSAIDs ie: Ibuprofen, Advil, Naproxen (Aleve), Motrin, BC and Goody Powder.  Pt denies that she and family members tested positive for COVID-19. ( pt tested negative on 12/21/18 and reminded to quarantine).  Coronavirus Screening  Pt denies that she and family members experienced the following symptoms:  Cough yes/no: No Fever (>100.67F)  yes/no: No Runny nose yes/no: No Sore throat yes/no: No Difficulty breathing/shortness of breath  yes/no: No  Have you or a family member traveled in the last 14 days and where? yes/no: No.   Pt reminded  that hospital visitation restrictions are in effect and the importance of the restrictions.   Pt verbalized understanding of all pre-op instructions.

## 2018-12-24 ENCOUNTER — Ambulatory Visit: Payer: Self-pay | Admitting: Ophthalmology

## 2018-12-24 NOTE — Anesthesia Preprocedure Evaluation (Addendum)
Anesthesia Evaluation  Patient identified by MRN, date of birth, ID band Patient awake    Reviewed: Allergy & Precautions, NPO status , Patient's Chart, lab work & pertinent test results  History of Anesthesia Complications Negative for: history of anesthetic complications  Airway Mallampati: II  TM Distance: >3 FB Neck ROM: Full    Dental  (+) Teeth Intact, Dental Advisory Given   Pulmonary Current Smoker,    Pulmonary exam normal breath sounds clear to auscultation       Cardiovascular negative cardio ROS Normal cardiovascular exam Rhythm:Regular Rate:Normal     Neuro/Psych PSYCHIATRIC DISORDERS Depression LEFT OCULAR LACERATION AND RUPTURE WITH PROLAPSE OR LOSS OF INTRAOCULAR TISSUE    GI/Hepatic negative GI ROS, Neg liver ROS,   Endo/Other  Obesity   Renal/GU negative Renal ROS     Musculoskeletal negative musculoskeletal ROS (+)   Abdominal   Peds  Hematology negative hematology ROS (+)   Anesthesia Other Findings Day of surgery medications reviewed with the patient.  Reproductive/Obstetrics negative OB ROS                            Anesthesia Physical Anesthesia Plan  ASA: II  Anesthesia Plan: General   Post-op Pain Management:    Induction: Intravenous  PONV Risk Score and Plan: 2 and Midazolam, Dexamethasone and Ondansetron  Airway Management Planned: Oral ETT  Additional Equipment:   Intra-op Plan:   Post-operative Plan: Extubation in OR  Informed Consent: I have reviewed the patients History and Physical, chart, labs and discussed the procedure including the risks, benefits and alternatives for the proposed anesthesia with the patient or authorized representative who has indicated his/her understanding and acceptance.     Dental advisory given  Plan Discussed with: CRNA  Anesthesia Plan Comments:        Anesthesia Quick Evaluation

## 2018-12-24 NOTE — Op Note (Signed)
Alexandria Lara 11/22/2018 Diagnosis: Ruptured globe left eye  Procedure: Ruptured globe repair left eye Operative Eye:  left eye  Surgeon: Royston Cowper Estimated Blood Loss: 88ml Specimens for Pathology:  None Complications: none  The  patient was prepped and draped in the usual fashion for ocular surgery on the  left eye .  A lid speculum was placed.  Care was taken during prep of the eye and placement of lid speculum to limit for the eye to mitigate further expulsion of uveal contents.  There was a corneoscleral laceration extending across the globe with expulsion of uveal contents.  Care was take to reposti the uvea and close the corneal incision with 10-0 nylon sutures. The adjacent scleral laceration extended under the lateral rectus muscle and was closed with 7-0 Vicryl suture.  Although uveal contents were reposited, marked hyphema was noted.  Once the wound was closed, the eye was reformed with BSS.    The speculum and drapes were removed and the eye was patched with Polymixin/Bacitracin ophthalmic ointment. An eye shield was placed and the patient was transferred alert and conversant with stable vital signs to the post operative recovery area.  The patient tolerated the procedure well and no complications were noted.  Royston Cowper MD

## 2018-12-24 NOTE — H&P (Deleted)
  The note originally documented on this encounter has been moved the the encounter in which it belongs.  

## 2018-12-24 NOTE — H&P (Signed)
  Date of examination:  01/24/19  Indication for surgery: vision loss left eye  Pertinent past medical history:  Past Medical History:  Diagnosis Date  . Adenotonsillar hypertrophy   . ADHD (attention deficit hyperactivity disorder)    on Vyvanse  . Bilateral external ear infections   . Chlamydia infection   . Depression   . Obesity   . Snores   . Vision abnormalities     Pertinent ocular history:  Globe rupture repair 11/22/18 with anterior chamber washout  Pertinent family history:  Family History  Problem Relation Age of Onset  . ADD / ADHD Father   . COPD Maternal Grandmother   . Heart disease Maternal Grandmother   . Multiple sclerosis Maternal Grandmother     General:  Healthy appearing patient in no distress.     Eyes:    Acuity : NLP OSs  External: Within normal limits      Anterior segment: Within normal limits      Motility:      Fundus: Normal      Refraction:    Impression:Globe rupture left eye s/p repair with associated visoon loft  Plan: Evisceration left eye  Royston Cowper

## 2018-12-25 ENCOUNTER — Ambulatory Visit (HOSPITAL_COMMUNITY): Payer: Self-pay | Admitting: Anesthesiology

## 2018-12-25 ENCOUNTER — Ambulatory Visit (HOSPITAL_COMMUNITY)
Admission: RE | Admit: 2018-12-25 | Discharge: 2018-12-25 | Disposition: A | Discharge status: Home / Self Care | Payer: Self-pay | Attending: Ophthalmology | Admitting: Ophthalmology

## 2018-12-25 ENCOUNTER — Encounter (HOSPITAL_COMMUNITY): Payer: Self-pay

## 2018-12-25 ENCOUNTER — Other Ambulatory Visit: Payer: Self-pay

## 2018-12-25 ENCOUNTER — Encounter (HOSPITAL_COMMUNITY)
Admission: RE | Disposition: A | Discharge status: Home / Self Care | Payer: Self-pay | Source: Home / Self Care | Attending: Ophthalmology

## 2018-12-25 DIAGNOSIS — S0522XA Ocular laceration and rupture with prolapse or loss of intraocular tissue, left eye, initial encounter: Secondary | ICD-10-CM | POA: Insufficient documentation

## 2018-12-25 DIAGNOSIS — E669 Obesity, unspecified: Secondary | ICD-10-CM | POA: Insufficient documentation

## 2018-12-25 DIAGNOSIS — F909 Attention-deficit hyperactivity disorder, unspecified type: Secondary | ICD-10-CM | POA: Insufficient documentation

## 2018-12-25 DIAGNOSIS — F17200 Nicotine dependence, unspecified, uncomplicated: Secondary | ICD-10-CM | POA: Insufficient documentation

## 2018-12-25 DIAGNOSIS — X58XXXA Exposure to other specified factors, initial encounter: Secondary | ICD-10-CM | POA: Insufficient documentation

## 2018-12-25 DIAGNOSIS — Z79899 Other long term (current) drug therapy: Secondary | ICD-10-CM | POA: Insufficient documentation

## 2018-12-25 DIAGNOSIS — Z6837 Body mass index (BMI) 37.0-37.9, adult: Secondary | ICD-10-CM | POA: Insufficient documentation

## 2018-12-25 DIAGNOSIS — H5462 Unqualified visual loss, left eye, normal vision right eye: Secondary | ICD-10-CM | POA: Insufficient documentation

## 2018-12-25 HISTORY — PX: EVISCERATION: SHX1539

## 2018-12-25 HISTORY — DX: Chlamydial infection, unspecified: A74.9

## 2018-12-25 HISTORY — DX: Depression, unspecified: F32.A

## 2018-12-25 LAB — POCT PREGNANCY, URINE: Preg Test, Ur: NEGATIVE

## 2018-12-25 SURGERY — REPAIR, EVISCERATION, ABDOMEN
Anesthesia: General | Site: Eye | Laterality: Left

## 2018-12-25 MED ORDER — PHENYLEPHRINE 40 MCG/ML (10ML) SYRINGE FOR IV PUSH (FOR BLOOD PRESSURE SUPPORT)
PREFILLED_SYRINGE | INTRAVENOUS | Status: DC | PRN
  Administered 2018-12-25 (×3): 80 ug via INTRAVENOUS
  Administered 2018-12-25: 120 ug via INTRAVENOUS

## 2018-12-25 MED ORDER — BSS IO SOLN
INTRAOCULAR | Status: DC | PRN
  Administered 2018-12-25: 15 mL via INTRAOCULAR

## 2018-12-25 MED ORDER — KETOROLAC TROMETHAMINE 30 MG/ML IJ SOLN
INTRAMUSCULAR | Status: AC
  Filled 2018-12-25: qty 1

## 2018-12-25 MED ORDER — PROPARACAINE HCL 0.5 % OP SOLN: 1.0000 [drp] | OPHTHALMIC | Status: DC | PRN

## 2018-12-25 MED ORDER — FENTANYL CITRATE (PF) 100 MCG/2ML IJ SOLN
INTRAMUSCULAR | Status: AC
  Administered 2018-12-25: 10:00:00 50 ug via INTRAVENOUS
  Filled 2018-12-25: qty 2

## 2018-12-25 MED ORDER — OFLOXACIN 0.3 % OP SOLN: 1.0000 [drp] | OPHTHALMIC | Status: DC | PRN

## 2018-12-25 MED ORDER — ROCURONIUM BROMIDE 10 MG/ML (PF) SYRINGE
PREFILLED_SYRINGE | INTRAVENOUS | Status: AC
  Filled 2018-12-25: qty 10

## 2018-12-25 MED ORDER — PROPARACAINE HCL 0.5 % OP SOLN
OPHTHALMIC | Status: AC
  Filled 2018-12-25: qty 15

## 2018-12-25 MED ORDER — ONDANSETRON HCL 4 MG/2ML IJ SOLN
INTRAMUSCULAR | Status: DC | PRN
  Administered 2018-12-25: 4 mg via INTRAVENOUS

## 2018-12-25 MED ORDER — OXYCODONE HCL 5 MG PO TABS
5.0000 mg | ORAL_TABLET | ORAL | Status: DC | PRN
  Administered 2018-12-25: 11:00:00 5 mg via ORAL

## 2018-12-25 MED ORDER — MIDAZOLAM HCL 5 MG/5ML IJ SOLN
INTRAMUSCULAR | Status: DC | PRN
  Administered 2018-12-25: 2 mg via INTRAVENOUS

## 2018-12-25 MED ORDER — FENTANYL CITRATE (PF) 250 MCG/5ML IJ SOLN
INTRAMUSCULAR | Status: DC | PRN
  Administered 2018-12-25: 100 ug via INTRAVENOUS
  Administered 2018-12-25: 50 ug via INTRAVENOUS

## 2018-12-25 MED ORDER — PROMETHAZINE HCL 25 MG/ML IJ SOLN: 6.2500 mg | INTRAMUSCULAR | Status: DC | PRN

## 2018-12-25 MED ORDER — BSS IO SOLN
INTRAOCULAR | Status: AC
  Filled 2018-12-25: qty 15

## 2018-12-25 MED ORDER — GENTAMICIN SULFATE 40 MG/ML IJ SOLN
INTRAMUSCULAR | Status: DC | PRN
  Administered 2018-12-25: 80 mg

## 2018-12-25 MED ORDER — SUCCINYLCHOLINE CHLORIDE 200 MG/10ML IV SOSY
PREFILLED_SYRINGE | INTRAVENOUS | Status: AC
  Filled 2018-12-25: qty 10

## 2018-12-25 MED ORDER — PROPOFOL 10 MG/ML IV BOLUS
INTRAVENOUS | Status: AC
  Filled 2018-12-25: qty 40

## 2018-12-25 MED ORDER — LACTATED RINGERS IV SOLN
INTRAVENOUS | Status: DC | PRN
  Administered 2018-12-25 (×2): via INTRAVENOUS

## 2018-12-25 MED ORDER — DEXAMETHASONE SODIUM PHOSPHATE 10 MG/ML IJ SOLN
INTRAMUSCULAR | Status: DC | PRN
  Administered 2018-12-25: 4 mg via INTRAVENOUS

## 2018-12-25 MED ORDER — ROCURONIUM BROMIDE 10 MG/ML (PF) SYRINGE
PREFILLED_SYRINGE | INTRAVENOUS | Status: DC | PRN
  Administered 2018-12-25: 50 mg via INTRAVENOUS

## 2018-12-25 MED ORDER — ACETAMINOPHEN 500 MG PO TABS
ORAL_TABLET | ORAL | Status: AC
  Administered 2018-12-25: 07:00:00 1000 mg via ORAL
  Filled 2018-12-25: qty 2

## 2018-12-25 MED ORDER — SUCCINYLCHOLINE CHLORIDE 200 MG/10ML IV SOSY
PREFILLED_SYRINGE | INTRAVENOUS | Status: DC | PRN
  Administered 2018-12-25: 100 mg via INTRAVENOUS

## 2018-12-25 MED ORDER — TOBRAMYCIN-DEXAMETHASONE 0.3-0.1 % OP OINT
TOPICAL_OINTMENT | OPHTHALMIC | Status: AC
  Filled 2018-12-25: qty 3.5

## 2018-12-25 MED ORDER — DEXAMETHASONE SODIUM PHOSPHATE 10 MG/ML IJ SOLN
INTRAMUSCULAR | Status: AC
  Filled 2018-12-25: qty 1

## 2018-12-25 MED ORDER — TOBRAMYCIN 0.3 % OP OINT
TOPICAL_OINTMENT | OPHTHALMIC | Status: DC | PRN
  Administered 2018-12-25: 1 via OPHTHALMIC

## 2018-12-25 MED ORDER — SUGAMMADEX SODIUM 200 MG/2ML IV SOLN
INTRAVENOUS | Status: DC | PRN
  Administered 2018-12-25: 200 mg via INTRAVENOUS

## 2018-12-25 MED ORDER — ACETAMINOPHEN 500 MG PO TABS
1000.0000 mg | ORAL_TABLET | Freq: Once | ORAL | Status: AC
  Administered 2018-12-25: 07:00:00 1000 mg via ORAL

## 2018-12-25 MED ORDER — PHENYLEPHRINE 40 MCG/ML (10ML) SYRINGE FOR IV PUSH (FOR BLOOD PRESSURE SUPPORT)
PREFILLED_SYRINGE | INTRAVENOUS | Status: AC
  Filled 2018-12-25: qty 10

## 2018-12-25 MED ORDER — TOBRAMYCIN-DEXAMETHASONE 0.3-0.1 % OP SUSP
OPHTHALMIC | Status: AC
  Filled 2018-12-25: qty 2.5

## 2018-12-25 MED ORDER — KETOROLAC TROMETHAMINE 30 MG/ML IJ SOLN
INTRAMUSCULAR | Status: DC | PRN
  Administered 2018-12-25: 30 mg via INTRAVENOUS

## 2018-12-25 MED ORDER — MIDAZOLAM HCL 2 MG/2ML IJ SOLN
INTRAMUSCULAR | Status: AC
  Filled 2018-12-25: qty 2

## 2018-12-25 MED ORDER — GENTAMICIN SULFATE 40 MG/ML IJ SOLN
INTRAMUSCULAR | Status: AC
  Filled 2018-12-25: qty 2

## 2018-12-25 MED ORDER — OXYCODONE HCL 5 MG PO TABS
ORAL_TABLET | ORAL | Status: AC
  Administered 2018-12-25: 11:00:00 5 mg via ORAL
  Filled 2018-12-25: qty 1

## 2018-12-25 MED ORDER — NEOMYCIN-POLYMYXIN-DEXAMETH 3.5-10000-0.1 OP OINT
TOPICAL_OINTMENT | OPHTHALMIC | Status: AC
  Filled 2018-12-25: qty 3.5

## 2018-12-25 MED ORDER — THROMBIN 20000 UNITS EX SOLR
CUTANEOUS | Status: AC
  Filled 2018-12-25: qty 20000

## 2018-12-25 MED ORDER — LIDOCAINE 2% (20 MG/ML) 5 ML SYRINGE
INTRAMUSCULAR | Status: DC | PRN
  Administered 2018-12-25: 100 mg via INTRAVENOUS

## 2018-12-25 MED ORDER — POLYMYXIN B SULFATE 500000 UNITS IJ SOLR
INTRAMUSCULAR | Status: DC | PRN
  Administered 2018-12-25: 10 [IU]

## 2018-12-25 MED ORDER — ERYTHROMYCIN 5 MG/GM OP OINT
TOPICAL_OINTMENT | OPHTHALMIC | Status: DC | PRN
  Administered 2018-12-25: 1 via OPHTHALMIC

## 2018-12-25 MED ORDER — BUPIVACAINE HCL (PF) 0.75 % IJ SOLN
INTRAMUSCULAR | Status: AC
  Filled 2018-12-25: qty 10

## 2018-12-25 MED ORDER — ERYTHROMYCIN 5 MG/GM OP OINT
TOPICAL_OINTMENT | OPHTHALMIC | Status: AC
  Filled 2018-12-25: qty 3.5

## 2018-12-25 MED ORDER — ONDANSETRON HCL 4 MG/2ML IJ SOLN
INTRAMUSCULAR | Status: AC
  Filled 2018-12-25: qty 2

## 2018-12-25 MED ORDER — OFLOXACIN 0.3 % OP SOLN
OPHTHALMIC | Status: AC
  Filled 2018-12-25: qty 5

## 2018-12-25 MED ORDER — LIDOCAINE HCL 2 % IJ SOLN
INTRAMUSCULAR | Status: AC
  Filled 2018-12-25: qty 20

## 2018-12-25 MED ORDER — FENTANYL CITRATE (PF) 250 MCG/5ML IJ SOLN
INTRAMUSCULAR | Status: AC
  Filled 2018-12-25: qty 5

## 2018-12-25 MED ORDER — POLYMYXIN B SULFATE 500000 UNITS IJ SOLR
INTRAMUSCULAR | Status: AC
  Filled 2018-12-25: qty 500000

## 2018-12-25 MED ORDER — LIDOCAINE 2% (20 MG/ML) 5 ML SYRINGE
INTRAMUSCULAR | Status: AC
  Filled 2018-12-25: qty 5

## 2018-12-25 MED ORDER — HYALURONIDASE HUMAN 150 UNIT/ML IJ SOLN
INTRAMUSCULAR | Status: AC
  Filled 2018-12-25: qty 1

## 2018-12-25 MED ORDER — FENTANYL CITRATE (PF) 100 MCG/2ML IJ SOLN
25.0000 ug | INTRAMUSCULAR | Status: DC | PRN
  Administered 2018-12-25 (×2): 50 ug via INTRAVENOUS

## 2018-12-25 MED ORDER — PROPOFOL 10 MG/ML IV BOLUS
INTRAVENOUS | Status: DC | PRN
  Administered 2018-12-25: 50 mg via INTRAVENOUS
  Administered 2018-12-25: 150 mg via INTRAVENOUS

## 2018-12-25 SURGICAL SUPPLY — 52 items
APPLICATOR COTTON TIP 6 STRL (MISCELLANEOUS) ×2 IMPLANT
APPLICATOR COTTON TIP 6IN STRL (MISCELLANEOUS) ×6
APPLICATOR DR MATTHEWS STRL (MISCELLANEOUS) ×12 IMPLANT
BLADE MVR KNIFE 19G (BLADE) ×3 IMPLANT
BNDG COHESIVE 2X5 WHT NS (GAUZE/BANDAGES/DRESSINGS) ×3 IMPLANT
BNDG GAUZE ELAST 4 BULKY (GAUZE/BANDAGES/DRESSINGS) ×3 IMPLANT
CONFORMER OPHTHALMIC MD W/HOLE (MISCELLANEOUS) ×3 IMPLANT
CORD BIPOLAR FORCEPS 12FT (ELECTRODE) ×3 IMPLANT
COVER WAND RF STERILE (DRAPES) IMPLANT
DRAPE HALF SHEET 40X57 (DRAPES) ×3 IMPLANT
DRAPE SLUSH MACHINE 52X66 (DRAPES) ×3 IMPLANT
DRSG TEGADERM 2-3/8X2-3/4 SM (GAUZE/BANDAGES/DRESSINGS) ×3 IMPLANT
GLOVE ECLIPSE 7.5 STRL STRAW (GLOVE) ×3 IMPLANT
GOWN STRL NON-REIN LRG LVL3 (GOWN DISPOSABLE) ×3 IMPLANT
GOWN STRL REUS W/ TWL LRG LVL3 (GOWN DISPOSABLE) ×2 IMPLANT
GOWN STRL REUS W/TWL LRG LVL3 (GOWN DISPOSABLE) ×4
HEMOSTAT SURGICEL 2X14 (HEMOSTASIS) ×3 IMPLANT
IMPL MEDPOR SPHERE EYE 18MM (Ophthalmic Related) ×1 IMPLANT
IMPLANT MEDPOR SPHERE EYE 18MM (Ophthalmic Related) ×3 IMPLANT
KIT BASIN OR (CUSTOM PROCEDURE TRAY) ×3 IMPLANT
KIT TURNOVER KIT B (KITS) ×3 IMPLANT
NEEDLE 18GX1X1/2 (RX/OR ONLY) (NEEDLE) ×3 IMPLANT
NEEDLE HYPO 25GX1X1/2 BEV (NEEDLE) ×3 IMPLANT
NS IRRIG 1000ML POUR BTL (IV SOLUTION) ×3 IMPLANT
PACK CATARACT CUSTOM (CUSTOM PROCEDURE TRAY) ×3 IMPLANT
PAD ARMBOARD 7.5X6 YLW CONV (MISCELLANEOUS) ×6 IMPLANT
PROTECTOR CORNEAL (OPHTHALMIC RELATED) ×3 IMPLANT
SOLUTION ANTI FOG 6CC (MISCELLANEOUS) IMPLANT
SPECIMEN JAR SMALL (MISCELLANEOUS) ×3 IMPLANT
SUCTION FRAZIER HANDLE 10FR (MISCELLANEOUS)
SUCTION FRAZIER TIP 8 FR DISP (SUCTIONS) ×2
SUCTION TUBE FRAZIER 10FR DISP (MISCELLANEOUS) IMPLANT
SUCTION TUBE FRAZIER 8FR DISP (SUCTIONS) ×1 IMPLANT
SUT CHROMIC 5 0 P 3 (SUTURE) IMPLANT
SUT MERSILENE 5 0 RD 1 DA (SUTURE) IMPLANT
SUT PROLENE 6 0 C 1 24 (SUTURE) IMPLANT
SUT SILK 2 0 (SUTURE)
SUT SILK 2 0 PERMA HAND 18 BK (SUTURE) ×3 IMPLANT
SUT SILK 2-0 18XBRD TIE 12 (SUTURE) IMPLANT
SUT SILK 4 0 P 3 (SUTURE) IMPLANT
SUT VIC AB 4-0 P-3 18X BRD (SUTURE) IMPLANT
SUT VIC AB 4-0 P3 18 (SUTURE)
SUT VIC AB 5-0 DAS24 8 (SUTURE) IMPLANT
SUT VICRYL 7 0 TG140 8 (SUTURE) IMPLANT
SUT VICRYL ABS 6-0 S29 18IN (SUTURE) IMPLANT
SWAB CULTURE ESWAB REG 1ML (MISCELLANEOUS) ×3 IMPLANT
SYR 20CC LL (SYRINGE) ×3 IMPLANT
SYRINGE 20CC LL (MISCELLANEOUS) ×2 IMPLANT
SYRINGE 60CC LL (MISCELLANEOUS) ×3 IMPLANT
SYSTEM CHEST DRAIN TLS 7FR (DRAIN) IMPLANT
TOWEL NATURAL 6PK STERILE (DISPOSABLE) ×3 IMPLANT
WATER STERILE IRR 1000ML POUR (IV SOLUTION) ×3 IMPLANT

## 2018-12-25 NOTE — Anesthesia Procedure Notes (Signed)
Procedure Name: Intubation Date/Time: 12/25/2018 7:44 AM Performed by: Renato Shin, CRNA Pre-anesthesia Checklist: Patient identified, Emergency Drugs available, Suction available and Patient being monitored Patient Re-evaluated:Patient Re-evaluated prior to induction Oxygen Delivery Method: Circle system utilized Preoxygenation: Pre-oxygenation with 100% oxygen Induction Type: IV induction and Rapid sequence Laryngoscope Size: Miller and 2 Grade View: Grade I Tube type: Oral Tube size: 7.0 mm Number of attempts: 1 Airway Equipment and Method: Stylet and Oral airway Placement Confirmation: ETT inserted through vocal cords under direct vision,  positive ETCO2 and breath sounds checked- equal and bilateral Secured at: 22 cm Tube secured with: Tape Dental Injury: Teeth and Oropharynx as per pre-operative assessment

## 2018-12-25 NOTE — Transfer of Care (Signed)
Immediate Anesthesia Transfer of Care Note  Patient: [facility]  Procedure(s) Performed: LEFT EYE EVISCERATION OF OCULAR CONTENTS WITH IMPLANT (Left Eye)  Patient Location: PACU  Anesthesia Type:General  Level of Consciousness: awake, alert  and patient cooperative  Airway & Oxygen Therapy: Patient Spontanous Breathing and Patient connected to nasal cannula oxygen  Post-op Assessment: Report given to RN and Post -op Vital signs reviewed and stable  Post vital signs: Reviewed and stable  Last Vitals:  Vitals Value Taken Time  BP 118/58 12/25/18 0938  Temp    Pulse 99 12/25/18 0941  Resp 19 12/25/18 0941  SpO2 94 % 12/25/18 0941  Vitals shown include unvalidated device data.  Last Pain:  Vitals:   12/25/18 0647  TempSrc: Oral  PainSc: 0-No pain         Complications: No apparent anesthesia complications

## 2018-12-25 NOTE — Discharge Instructions (Signed)
SLEEP WITH HEAD ELEVATED  KEEP PATCH IN PLACE  TYLENOL (DO NOT EXCEED 6 GRAMS IN ONE 24 HOURS PERIOD)  OR IBUPROFEN (DO NOT EXCEED 2400MG  IN ONE 24 HOUR PERIOD) FOR PAIN AS INSTRUCTED ON BOTTLE

## 2018-12-25 NOTE — Anesthesia Postprocedure Evaluation (Signed)
Anesthesia Post Note  Patient: Astronomer  Procedure(s) Performed: LEFT EYE EVISCERATION OF OCULAR CONTENTS WITH IMPLANT (Left Eye)     Patient location during evaluation: PACU Anesthesia Type: General Level of consciousness: awake and alert, awake and oriented Pain management: pain level controlled Vital Signs Assessment: post-procedure vital signs reviewed and stable Respiratory status: spontaneous breathing, nonlabored ventilation, respiratory function stable and patient connected to nasal cannula oxygen Cardiovascular status: blood pressure returned to baseline and stable Postop Assessment: no apparent nausea or vomiting Anesthetic complications: no    Last Vitals:  Vitals:   12/25/18 1040 12/25/18 1110  BP:  122/74  Pulse:  80  Resp:  19  Temp: (!) 36.2 C   SpO2:  96%    Last Pain:  Vitals:   12/25/18 1110  TempSrc:   PainSc: [place]

## 2018-12-25 NOTE — Brief Op Note (Signed)
12/25/2018  11:34 AM  PATIENT:  Alexandria Lara  23 y.o. female  PRE-OPERATIVE DIAGNOSIS:  LEFT OCULAR LACERATION AND RUPTURE WITH PROLAPSE OR LOSS OF INTRAOCULAR TISSUE  POST-OPERATIVE DIAGNOSIS:  LEFT OCULAR LACERATION AND RUPTURE WITH PROLAPSE OR LOSS OF INTRAOCULAR TISSUE  PROCEDURE:  Procedure(s): LEFT EYE EVISCERATION OF OCULAR CONTENTS WITH IMPLANT (Left)  SURGEON:  Surgeon(s) and Role:    * Jalene Mullet, MD - Primary  PHYSICIAN ASSISTANT:   ASSISTANTS: none   ANESTHESIA:   local, general   EBL:  50 mL   BLOOD ADMINISTERED:none  DRAINS: none   LOCAL MEDICATIONS USED:  MARCAINE    and LIDOCAINE   SPECIMEN:  Source of Specimen:  UVEAL CONTENTS  DISPOSITION OF SPECIMEN:  PATHOLOGY  COUNTS:  YES  TOURNIQUET:  * No tourniquets in log *  DICTATION: .Note written in EPIC  PLAN OF CARE: Discharge to home after PACU  PATIENT DISPOSITION:  PACU - hemodynamically stable.   Delay start of Pharmacological VTE agent (>24hrs) due to surgical blood loss or risk of bleeding: not applicable

## 2018-12-26 ENCOUNTER — Encounter (HOSPITAL_COMMUNITY): Payer: Self-pay | Admitting: Ophthalmology

## 2019-01-09 NOTE — Op Note (Signed)
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  The patient was prepped and draped in the usual fashion for ocular surgery on the left eye. A lid speculum was placed. A 360 peritomy was performed.   Stevens scissors were used to cut out the corneal button along with the iris/ciliary body. Curretes were used to remove the uvea.  The corneoscleral button and uvea were sent for pathology.  Hemostasis was achieved with endocautery. Alcohol soaked cotton swabs were used to remove any remaining uvea.  A cold test tube filled with frozen saline was also used to achieve further hemostasis. The sclera was carefully inspected and noted to be free of uveal tissue. A 35mm implant was shaved to allow implantation and then placed in the scleral pocket and the sclera closed over and secured with 5-0 Vicryl sutures.  Tenons was closed over the sclera with 8-0 Vicryl suture. Conjunctiva was closed with a 8-0 Vicryl suture.  A Scleral shield was secured in placed with 4-0 nylon suture. Maxitrol ointment was placed through the holes of the scleral shield and in the fornix. The eye was patched with a sterile patch and shield. The patient was revived from general anesthesia and sent to the recovery room in stable condition.    Royston Cowper MD

## 2019-01-27 ENCOUNTER — Encounter (HOSPITAL_COMMUNITY): Payer: Self-pay | Admitting: *Deleted

## 2019-01-27 ENCOUNTER — Emergency Department (HOSPITAL_COMMUNITY)
Admission: EM | Admit: 2019-01-27 | Discharge: 2019-01-27 | Disposition: A | Discharge status: Home / Self Care | Payer: Medicaid Other | Attending: Emergency Medicine | Admitting: Emergency Medicine

## 2019-01-27 DIAGNOSIS — H5712 Ocular pain, left eye: Secondary | ICD-10-CM | POA: Insufficient documentation

## 2019-01-27 MED ORDER — NEOMYCIN-POLYMYXIN-DEXAMETH 3.5-10000-0.1 OP SUSP: 1.0000 [drp] | Freq: Four times a day (QID) | OPHTHALMIC | 0 refills | Status: DC

## 2019-01-27 NOTE — ED Notes (Signed)
Patient verbalizes understanding of discharge instructions. Opportunity for questioning and answers were provided. Armband removed by staff, pt discharged from ED.  

## 2019-01-27 NOTE — Discharge Instructions (Signed)
Place the drops as prescribed.  Follow-up with Dr. Serita Grit office on Monday morning.  If you develop swelling to your face or redness or warmth please seek reevaluation in the emergency department.
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
## 2019-01-27 NOTE — ED Triage Notes (Signed)
To ED for eval of left eye socket pain. Pt had eye removed 3 weeks ago her at [facility]. States the glass piece that was replacing continues to fall out and there 'something hanging back there'. She called Dr Serita Grit (surgeon's) office and told to go 'to some place that was closed'. Pt with metal patch covering left eye

## 2020-09-17 ENCOUNTER — Emergency Department (HOSPITAL_COMMUNITY): Payer: Self-pay

## 2020-09-17 ENCOUNTER — Other Ambulatory Visit: Payer: Self-pay

## 2020-09-17 ENCOUNTER — Inpatient Hospital Stay (HOSPITAL_COMMUNITY)
Admission: EM | Admit: 2020-09-17 | Discharge: 2020-09-19 | DRG: 872 | Disposition: A | Discharge status: Home / Self Care | Payer: Self-pay | Attending: Family Medicine | Admitting: Family Medicine

## 2020-09-17 ENCOUNTER — Encounter (HOSPITAL_COMMUNITY): Payer: Self-pay | Admitting: *Deleted

## 2020-09-17 DIAGNOSIS — Z8249 Family history of ischemic heart disease and other diseases of the circulatory system: Secondary | ICD-10-CM

## 2020-09-17 DIAGNOSIS — Z87442 Personal history of urinary calculi: Secondary | ICD-10-CM

## 2020-09-17 DIAGNOSIS — F909 Attention-deficit hyperactivity disorder, unspecified type: Secondary | ICD-10-CM | POA: Diagnosis present

## 2020-09-17 DIAGNOSIS — Z20822 Contact with and (suspected) exposure to covid-19: Secondary | ICD-10-CM | POA: Diagnosis present

## 2020-09-17 DIAGNOSIS — N136 Pyonephrosis: Secondary | ICD-10-CM | POA: Diagnosis present

## 2020-09-17 DIAGNOSIS — H5462 Unqualified visual loss, left eye, normal vision right eye: Secondary | ICD-10-CM | POA: Diagnosis present

## 2020-09-17 DIAGNOSIS — Z720 Tobacco use: Secondary | ICD-10-CM

## 2020-09-17 DIAGNOSIS — E66813 Obesity, class 3: Secondary | ICD-10-CM | POA: Diagnosis present

## 2020-09-17 DIAGNOSIS — N12 Tubulo-interstitial nephritis, not specified as acute or chronic: Secondary | ICD-10-CM | POA: Diagnosis present

## 2020-09-17 DIAGNOSIS — Z825 Family history of asthma and other chronic lower respiratory diseases: Secondary | ICD-10-CM

## 2020-09-17 DIAGNOSIS — Z6841 Body Mass Index (BMI) 40.0 and over, adult: Secondary | ICD-10-CM

## 2020-09-17 DIAGNOSIS — R1115 Cyclical vomiting syndrome unrelated to migraine: Secondary | ICD-10-CM | POA: Diagnosis present

## 2020-09-17 DIAGNOSIS — F32A Depression, unspecified: Secondary | ICD-10-CM | POA: Diagnosis present

## 2020-09-17 DIAGNOSIS — Z818 Family history of other mental and behavioral disorders: Secondary | ICD-10-CM

## 2020-09-17 DIAGNOSIS — E871 Hypo-osmolality and hyponatremia: Secondary | ICD-10-CM | POA: Diagnosis present

## 2020-09-17 DIAGNOSIS — A4151 Sepsis due to Escherichia coli [E. coli]: Principal | ICD-10-CM | POA: Diagnosis present

## 2020-09-17 DIAGNOSIS — Z820 Family history of epilepsy and other diseases of the nervous system: Secondary | ICD-10-CM

## 2020-09-17 DIAGNOSIS — Z79899 Other long term (current) drug therapy: Secondary | ICD-10-CM

## 2020-09-17 DIAGNOSIS — E876 Hypokalemia: Secondary | ICD-10-CM | POA: Diagnosis present

## 2020-09-17 DIAGNOSIS — N390 Urinary tract infection, site not specified: Secondary | ICD-10-CM

## 2020-09-17 DIAGNOSIS — A419 Sepsis, unspecified organism: Secondary | ICD-10-CM | POA: Diagnosis present

## 2020-09-17 LAB — CBC WITH DIFFERENTIAL/PLATELET
Abs Immature Granulocytes: 0.08 10*3/uL — ABNORMAL HIGH (ref 0.00–0.07)
Basophils Absolute: 0 10*3/uL (ref 0.0–0.1)
Basophils Relative: 0 %
Eosinophils Absolute: 0.3 10*3/uL (ref 0.0–0.5)
Eosinophils Relative: 2 %
HCT: 38.5 % (ref 36.0–46.0)
Hemoglobin: 11.9 g/dL — ABNORMAL LOW (ref 12.0–15.0)
Immature Granulocytes: 1 %
Lymphocytes Relative: 10 %
Lymphs Abs: 1.4 10*3/uL (ref 0.7–4.0)
MCH: 29 pg (ref 26.0–34.0)
MCHC: 30.9 g/dL (ref 30.0–36.0)
MCV: 93.7 fL (ref 80.0–100.0)
Monocytes Absolute: 1.6 10*3/uL — ABNORMAL HIGH (ref 0.1–1.0)
Monocytes Relative: 12 %
Neutro Abs: 9.8 10*3/uL — ABNORMAL HIGH (ref 1.7–7.7)
Neutrophils Relative %: 75 %
Platelets: 155 10*3/uL (ref 150–400)
RBC: 4.11 MIL/uL (ref 3.87–5.11)
RDW: 13.5 % (ref 11.5–15.5)
WBC: 13.1 10*3/uL — ABNORMAL HIGH (ref 4.0–10.5)
nRBC: 0 % (ref 0.0–0.2)

## 2020-09-17 LAB — URINALYSIS, ROUTINE W REFLEX MICROSCOPIC
Bilirubin Urine: NEGATIVE
Glucose, UA: NEGATIVE mg/dL
Ketones, ur: 20 mg/dL — AB
Nitrite: POSITIVE — AB
Protein, ur: 100 mg/dL — AB
Specific Gravity, Urine: 1.01 (ref 1.005–1.030)
WBC, UA: >50 WBC/hpf — ABNORMAL HIGH (ref 0–5)
pH: 6 (ref 5.0–8.0)

## 2020-09-17 LAB — COMPREHENSIVE METABOLIC PANEL
ALT: 21 U/L (ref 0–44)
AST: 23 U/L (ref 15–41)
Albumin: 3.2 g/dL — ABNORMAL LOW (ref 3.5–5.0)
Alkaline Phosphatase: 80 U/L (ref 38–126)
Anion gap: 11 (ref 5–15)
BUN: 9 mg/dL (ref 6–20)
CO2: 20 mmol/L — ABNORMAL LOW (ref 22–32)
Calcium: 8.2 mg/dL — ABNORMAL LOW (ref 8.9–10.3)
Chloride: 101 mmol/L (ref 98–111)
Creatinine, Ser: 0.85 mg/dL (ref 0.44–1.00)
GFR, Estimated: >60 mL/min (ref >60)
Glucose, Bld: 89 mg/dL (ref 70–99)
Potassium: 4.1 mmol/L (ref 3.5–5.1)
Sodium: 132 mmol/L — ABNORMAL LOW (ref 135–145)
Total Bilirubin: 1.6 mg/dL — ABNORMAL HIGH (ref 0.3–1.2)
Total Protein: 7.2 g/dL (ref 6.5–8.1)

## 2020-09-17 LAB — HIV ANTIBODY (ROUTINE TESTING W REFLEX): HIV Screen 4th Generation wRfx: NONREACTIVE

## 2020-09-17 LAB — LACTIC ACID, PLASMA
Lactic Acid, Venous: 0.8 mmol/L (ref 0.5–1.9)
Lactic Acid, Venous: 1.1 mmol/L (ref 0.5–1.9)

## 2020-09-17 LAB — LIPASE, BLOOD: Lipase: 21 U/L (ref 11–51)

## 2020-09-17 LAB — POC URINE PREG, ED: Preg Test, Ur: NEGATIVE

## 2020-09-17 MED ORDER — POLYETHYLENE GLYCOL 3350 17 G PO PACK: 17.0000 g | PACK | Freq: Every day | ORAL | Status: DC | PRN

## 2020-09-17 MED ORDER — SODIUM CHLORIDE 0.9 % IV BOLUS
1000.0000 mL | Freq: Once | INTRAVENOUS | Status: AC
  Administered 2020-09-17: 1000 mL via INTRAVENOUS

## 2020-09-17 MED ORDER — SODIUM CHLORIDE 0.9% FLUSH: 3.0000 mL | Freq: Two times a day (BID) | INTRAVENOUS | Status: DC

## 2020-09-17 MED ORDER — SODIUM CHLORIDE 0.9 % IV SOLN
12.5000 mg | Freq: Once | INTRAVENOUS | Status: DC
  Filled 2020-09-17: qty 0.5

## 2020-09-17 MED ORDER — ACETAMINOPHEN 325 MG PO TABS
650.0000 mg | ORAL_TABLET | Freq: Four times a day (QID) | ORAL | Status: DC | PRN
  Administered 2020-09-18 (×2): 650 mg via ORAL
  Filled 2020-09-17 (×2): qty 2

## 2020-09-17 MED ORDER — SODIUM CHLORIDE 0.9 % IV SOLN: 250.0000 mL | INTRAVENOUS | Status: DC | PRN

## 2020-09-17 MED ORDER — HEPARIN SODIUM (PORCINE) 5000 UNIT/ML IJ SOLN
5000.0000 [IU] | Freq: Three times a day (TID) | INTRAMUSCULAR | Status: DC
  Administered 2020-09-17 – 2020-09-19 (×5): 5000 [IU] via SUBCUTANEOUS
  Filled 2020-09-17 (×5): qty 1

## 2020-09-17 MED ORDER — ACETAMINOPHEN 650 MG RE SUPP: 650.0000 mg | Freq: Four times a day (QID) | RECTAL | Status: DC | PRN

## 2020-09-17 MED ORDER — ONDANSETRON HCL 4 MG/2ML IJ SOLN
4.0000 mg | Freq: Once | INTRAMUSCULAR | Status: AC
  Administered 2020-09-17: 4 mg via INTRAVENOUS
  Filled 2020-09-17: qty 2

## 2020-09-17 MED ORDER — SODIUM CHLORIDE 0.9% FLUSH: 3.0000 mL | INTRAVENOUS | Status: DC | PRN

## 2020-09-17 MED ORDER — ONDANSETRON HCL 4 MG/2ML IJ SOLN: 4.0000 mg | Freq: Four times a day (QID) | INTRAMUSCULAR | Status: DC | PRN

## 2020-09-17 MED ORDER — FENTANYL CITRATE (PF) 100 MCG/2ML IJ SOLN: 25.0000 ug | INTRAMUSCULAR | Status: DC | PRN

## 2020-09-17 MED ORDER — SODIUM CHLORIDE 0.9 % IV SOLN
1.0000 g | Freq: Once | INTRAVENOUS | Status: AC
  Administered 2020-09-17: 1 g via INTRAVENOUS
  Filled 2020-09-17: qty 10

## 2020-09-17 MED ORDER — SODIUM CHLORIDE 0.9 % IV SOLN: INTRAVENOUS | Status: DC

## 2020-09-17 MED ORDER — MORPHINE SULFATE (PF) 4 MG/ML IV SOLN
4.0000 mg | Freq: Once | INTRAVENOUS | Status: AC
  Administered 2020-09-17: 4 mg via INTRAVENOUS
  Filled 2020-09-17: qty 1

## 2020-09-17 MED ORDER — OXYCODONE HCL 5 MG PO TABS
5.0000 mg | ORAL_TABLET | ORAL | Status: DC | PRN
Start: 2020-09-17 — End: 2020-09-19
  Administered 2020-09-18 (×2): 5 mg via ORAL
  Filled 2020-09-17 (×2): qty 1

## 2020-09-17 MED ORDER — ONDANSETRON HCL 4 MG PO TABS: 4.0000 mg | ORAL_TABLET | Freq: Four times a day (QID) | ORAL | Status: DC | PRN

## 2020-09-17 MED ORDER — TRAZODONE HCL 50 MG PO TABS: 50.0000 mg | ORAL_TABLET | Freq: Every evening | ORAL | Status: DC | PRN

## 2020-09-17 MED ORDER — BISACODYL 10 MG RE SUPP: 10.0000 mg | Freq: Every day | RECTAL | Status: DC | PRN

## 2020-09-17 MED ORDER — KETOROLAC TROMETHAMINE 30 MG/ML IJ SOLN
15.0000 mg | Freq: Once | INTRAMUSCULAR | Status: AC
  Administered 2020-09-17: 15 mg via INTRAVENOUS
  Filled 2020-09-17: qty 1

## 2020-09-17 NOTE — ED Triage Notes (Signed)
Pt in from home c/o R flank pain onset x 4 days, denies dysuria, denies hematuria, reports onset n/v x 3 days with x 2 episodes of emesis today, pt LMP 07/30/20-08/01/20, pt c/o fever and chills, A&O x4

## 2020-09-17 NOTE — ED Notes (Signed)
Patient transported to CT 

## 2020-09-17 NOTE — ED Notes (Signed)
Pt vomiting.
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
## 2020-09-17 NOTE — Plan of Care (Signed)
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
## 2020-09-17 NOTE — ED Notes (Signed)
X2 IV attempts, Inetta Fermo, RN to attempt IV access

## 2020-09-17 NOTE — ED Notes (Signed)
Ginger Ale given  

## 2020-09-18 ENCOUNTER — Encounter (HOSPITAL_COMMUNITY): Payer: Self-pay | Admitting: Family Medicine

## 2020-09-18 DIAGNOSIS — A419 Sepsis, unspecified organism: Secondary | ICD-10-CM | POA: Diagnosis present

## 2020-09-18 LAB — CBC
HCT: 33.4 % — ABNORMAL LOW (ref 36.0–46.0)
Hemoglobin: 11 g/dL — ABNORMAL LOW (ref 12.0–15.0)
MCH: 29.2 pg (ref 26.0–34.0)
MCHC: 32.9 g/dL (ref 30.0–36.0)
MCV: 88.6 fL (ref 80.0–100.0)
Platelets: 209 10*3/uL (ref 150–400)
RBC: 3.77 MIL/uL — ABNORMAL LOW (ref 3.87–5.11)
RDW: 13.5 % (ref 11.5–15.5)
WBC: 12 10*3/uL — ABNORMAL HIGH (ref 4.0–10.5)
nRBC: 0 % (ref 0.0–0.2)

## 2020-09-18 LAB — BASIC METABOLIC PANEL
Anion gap: 9 (ref 5–15)
BUN: 8 mg/dL (ref 6–20)
CO2: 19 mmol/L — ABNORMAL LOW (ref 22–32)
Calcium: 7.4 mg/dL — ABNORMAL LOW (ref 8.9–10.3)
Chloride: 102 mmol/L (ref 98–111)
Creatinine, Ser: 0.76 mg/dL (ref 0.44–1.00)
GFR, Estimated: >60 mL/min (ref >60)
Glucose, Bld: 84 mg/dL (ref 70–99)
Potassium: 3.2 mmol/L — ABNORMAL LOW (ref 3.5–5.1)
Sodium: 130 mmol/L — ABNORMAL LOW (ref 135–145)

## 2020-09-18 LAB — SARS CORONAVIRUS 2 (TAT 6-24 HRS): SARS Coronavirus 2: NEGATIVE

## 2020-09-18 MED ORDER — SODIUM CHLORIDE 0.9 % IV SOLN: 2.0000 g | INTRAVENOUS | Status: DC

## 2020-09-18 MED ORDER — SODIUM CHLORIDE 0.9 % IV SOLN
1.0000 g | Freq: Once | INTRAVENOUS | Status: AC
  Administered 2020-09-18: 1 g via INTRAVENOUS
  Filled 2020-09-18: qty 10

## 2020-09-18 MED ORDER — POTASSIUM CHLORIDE CRYS ER 20 MEQ PO TBCR
40.0000 meq | EXTENDED_RELEASE_TABLET | ORAL | Status: AC
  Administered 2020-09-18 (×2): 40 meq via ORAL
  Filled 2020-09-18 (×2): qty 2
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
## 2020-09-18 NOTE — Progress Notes (Signed)
°   09/18/20 1352  Assess: MEWS Score  Temp (!) 102.4 F (39.1 C)  BP 112/66  Pulse Rate (!) 109  Resp 16  SpO2 99 %  O2 Device Room Air  Assess: MEWS Score  MEWS Temp 2  MEWS Systolic 0  MEWS Pulse 1  MEWS RR 0  MEWS LOC 0  MEWS Score 3  MEWS Score Color Yellow  Assess: if the MEWS score is Yellow or Red  Were vital signs taken at a resting state? Yes  Focused Assessment No change from prior assessment  Early Detection of Sepsis Score *See Row Information* Low  MEWS guidelines implemented *See Row Information* Yes  Treat  MEWS Interventions Administered prn meds/treatments  Pain Scale 0-10  Pain Score 0  Take Vital Signs  Increase Vital Sign Frequency  Yellow: Q 2hr X 2 then Q 4hr X 2, if remains yellow, continue Q 4hrs  Escalate  MEWS: Escalate Yellow: discuss with charge nurse/RN and consider discussing with provider and RRT  Notify: Charge Nurse/RN  Name of Charge Nurse/RN Notified nancy, rn  Date Charge Nurse/RN Notified 09/18/20  Time Charge Nurse/RN Notified 1619  Notify: Provider  Provider Name/Title [email or web address]  Date Provider Notified 09/18/20  Time Provider Notified 1355  Notification Type Page  Notification Reason Change in status;Critical result  Provider response Other (Comment) (continue to monitor)  Date of Provider Response 09/18/20  Time of Provider Response 1400  Document  Patient Outcome Stabilized after interventions  Progress note created (see row info) Yes

## 2020-09-19 LAB — CBC
HCT: 35.7 % — ABNORMAL LOW (ref 36.0–46.0)
Hemoglobin: 11.4 g/dL — ABNORMAL LOW (ref 12.0–15.0)
MCH: 28.4 pg (ref 26.0–34.0)
MCHC: 31.9 g/dL (ref 30.0–36.0)
MCV: 88.8 fL (ref 80.0–100.0)
Platelets: 223 10*3/uL (ref 150–400)
RBC: 4.02 MIL/uL (ref 3.87–5.11)
RDW: 13.6 % (ref 11.5–15.5)
WBC: 7.1 10*3/uL (ref 4.0–10.5)
nRBC: 0 % (ref 0.0–0.2)

## 2020-09-19 LAB — BASIC METABOLIC PANEL
Anion gap: 11 (ref 5–15)
BUN: 8 mg/dL (ref 6–20)
CO2: 20 mmol/L — ABNORMAL LOW (ref 22–32)
Calcium: 8.2 mg/dL — ABNORMAL LOW (ref 8.9–10.3)
Chloride: 104 mmol/L (ref 98–111)
Creatinine, Ser: 0.6 mg/dL (ref 0.44–1.00)
GFR, Estimated: >60 mL/min (ref >60)
Glucose, Bld: 78 mg/dL (ref 70–99)
Potassium: 4.1 mmol/L (ref 3.5–5.1)
Sodium: 135 mmol/L (ref 135–145)

## 2020-09-19 MED ORDER — SODIUM CHLORIDE 0.9 % IV SOLN
2.0000 g | Freq: Once | INTRAVENOUS | Status: AC
  Administered 2020-09-19: 2 g via INTRAVENOUS
  Filled 2020-09-19: qty 20

## 2020-09-19 MED ORDER — CEFDINIR 300 MG PO CAPS: 300.0000 mg | ORAL_CAPSULE | Freq: Two times a day (BID) | ORAL | 0 refills | Status: AC

## 2020-09-19 MED ORDER — ACETAMINOPHEN 325 MG PO TABS: 650.0000 mg | ORAL_TABLET | Freq: Four times a day (QID) | ORAL | 0 refills | Status: AC | PRN

## 2020-09-19 NOTE — Progress Notes (Signed)
Nsg Discharge Note  Admit Date:  09/17/2020 Discharge date: 09/19/2020   [facility] to be D/C'd Home per MD order.  AVS completed.  Copy for chart, and copy for patient signed, and dated. Patient/caregiver able to verbalize understanding.  Discharge Medication: Allergies as of 09/19/2020   No Known Allergies     Medication List    STOP taking these medications   neomycin-polymyxin b-dexamethasone 3.5-10000-0.1 Susp Commonly known as: MAXITROL     TAKE these medications   acetaminophen 325 MG tablet Commonly known as: TYLENOL Take 2 tablets (650 mg total) by mouth every 6 (six) hours as needed for mild pain, fever or headache (or Fever >/= 101).   cefdinir 300 MG capsule Commonly known as: OMNICEF Take 1 capsule (300 mg total) by mouth 2 (two) times daily for 5 days. Start taking on: September 20, 2020       Discharge Assessment: Vitals:   09/19/20 0230 09/19/20 0518  BP: (!) 103/56 117/69  Pulse: 75 89  Resp: 18 20  Temp: 98.5 F (36.9 C) 98.3 F (36.8 C)  SpO2: 100% 99%   Skin clean, dry and intact without evidence of skin break down, no evidence of skin tears noted. IV catheter discontinued intact. Site without signs and symptoms of complications - no redness or edema noted at insertion site, patient denies c/o pain - only slight tenderness at site.  Dressing with slight pressure applied.  D/c Instructions-Education: Discharge instructions given to patient/family with verbalized understanding. D/c education completed with patient/family including follow up instructions, medication list, d/c activities limitations if indicated, with other d/c instructions as indicated by MD - patient able to verbalize understanding, all questions fully answered. Patient instructed to return to ED, call 911, or call MD for any changes in condition.  Patient escorted via WC, and D/C home via private auto.  Brandy Hale, LPN 5/99/3570 17:79 PM
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
## 2020-09-19 NOTE — Discharge Instructions (Signed)
1)You have bladder and kidney infection with a bacteria----please take Omnicef/cefdinir antibiotic starting on Saturday, 09/20/2020 twice daily for 5 days 2)Please call [phone] on Saturday 09/20/2020 after 2 PM to get the results of your urine culture test--we may have to change antibiotics depending on the kind of bacteria in the urine growth 3) please drink plenty fluids

## 2020-09-20 LAB — URINE CULTURE: Culture: >=100000 — AB

## 2020-09-22 LAB — CULTURE, BLOOD (ROUTINE X 2)
Culture: NO GROWTH
Culture: NO GROWTH
Special Requests: ADEQUATE

## 2021-04-08 IMAGING — CT CT RENAL STONE PROTOCOL
2 of 4 series · 16 of 46 positions shown, 18 images · non-contrast
Comparison: None.

CLINICAL DATA: Right flank pain, fever, nephrolithiasis

EXAM:
CT ABDOMEN AND PELVIS WITHOUT CONTRAST
TECHNIQUE: Multidetector CT imaging of the abdomen and pelvis was performed
following the standard protocol without IV contrast.

[Series 2: axial st · axial · 0.83mm/px · z∈[+780,+1184]mm · 13 of 93 slices shown, 15 images]
[im 6/93  soft-tissue]
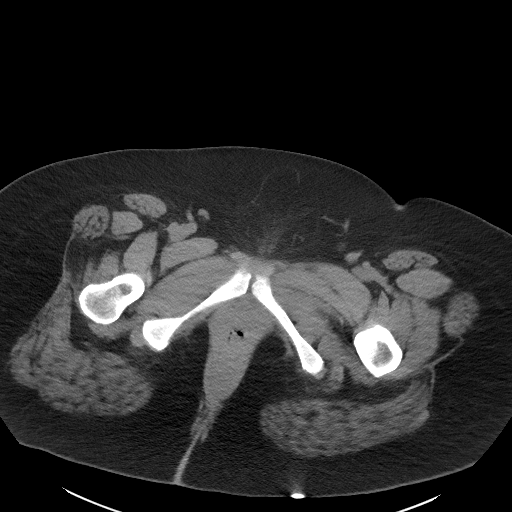
[im 6/93  bone]
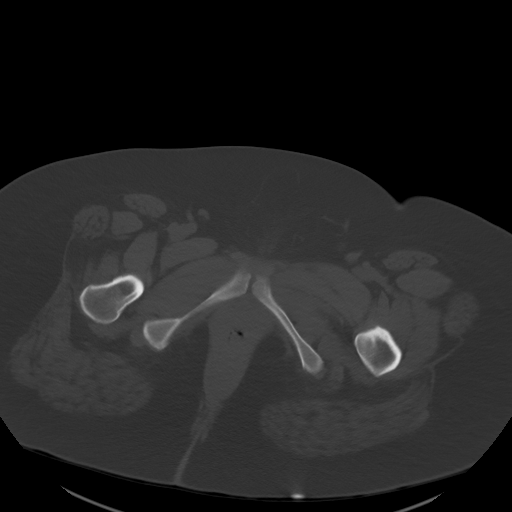
[im 12/93  soft-tissue]
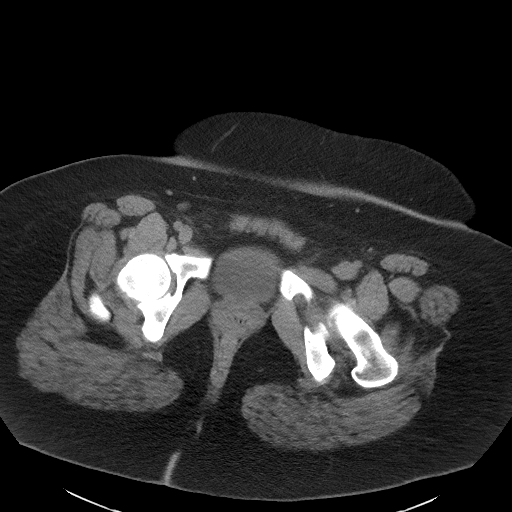
[im 18/93  soft-tissue]
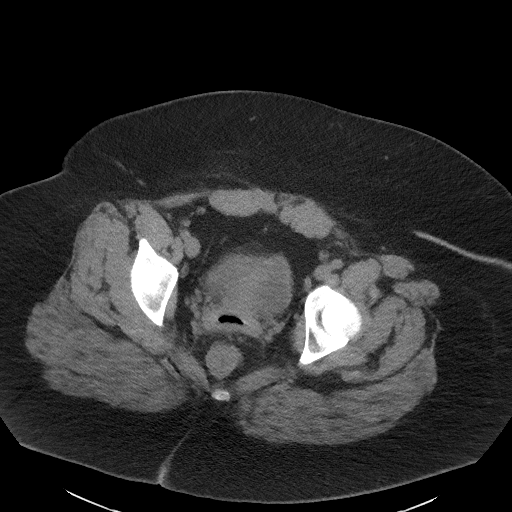
[im 29/93  soft-tissue]
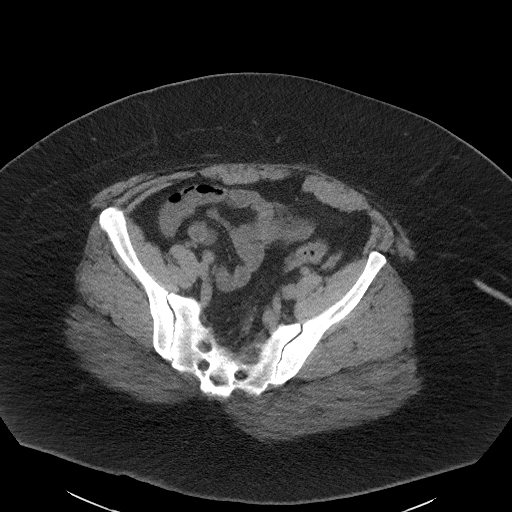
[im 35/93  soft-tissue]
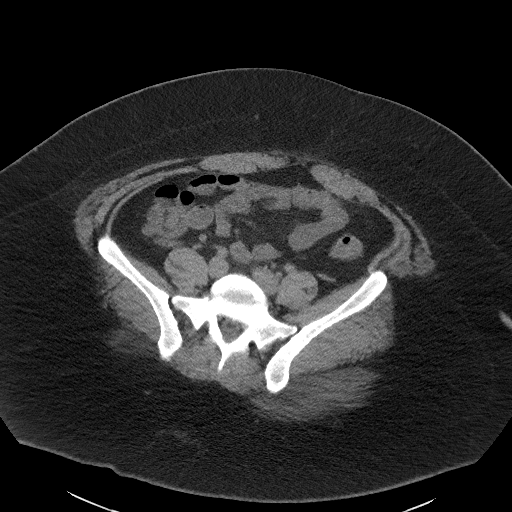
[im 41/93  soft-tissue]
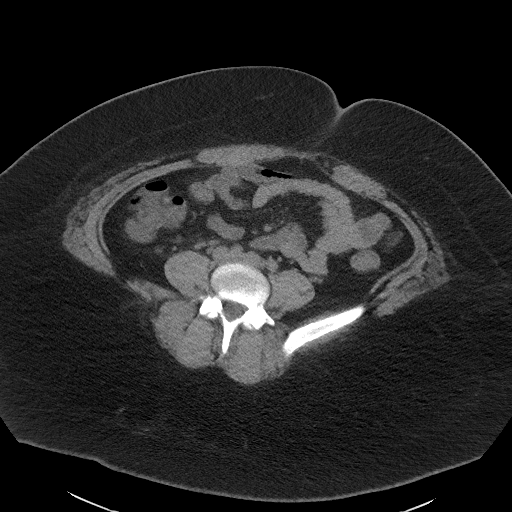
[im 47/93  soft-tissue]
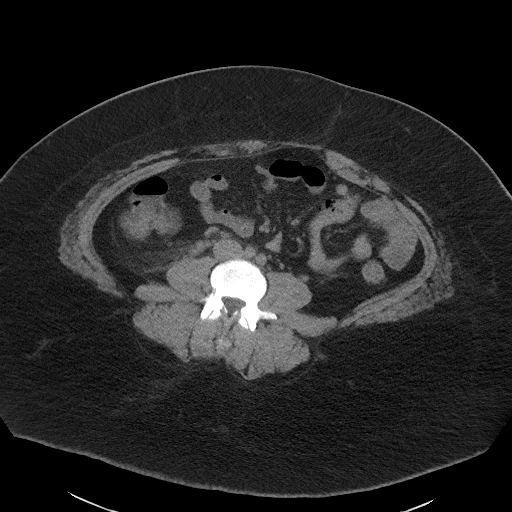
[im 52/93  soft-tissue]
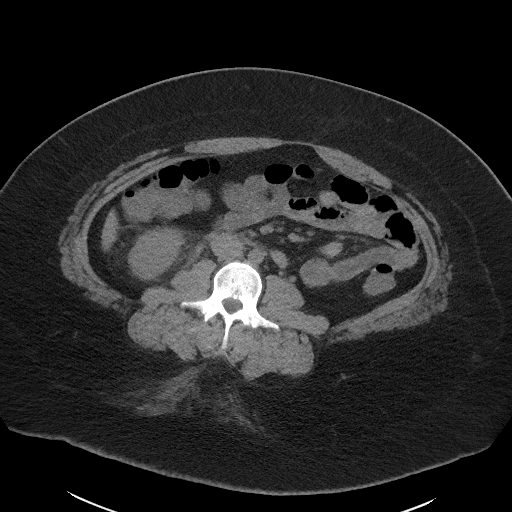
[im 58/93  soft-tissue]
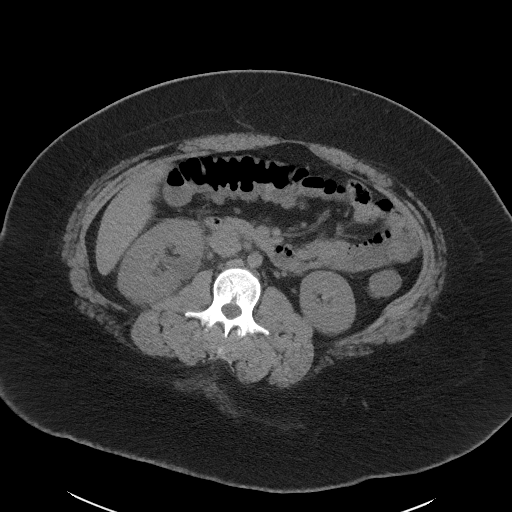
[im 58/93  bone]
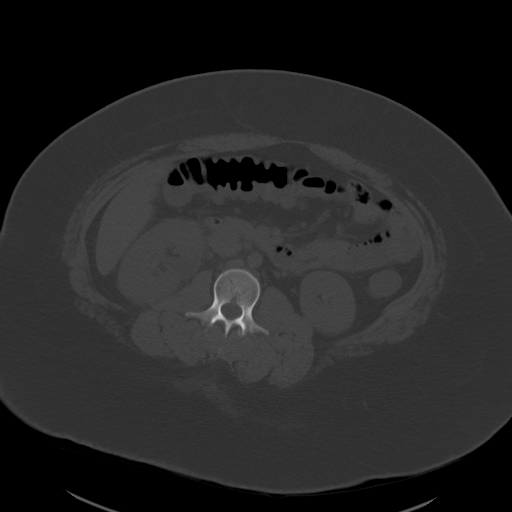
[im 64/93  soft-tissue]
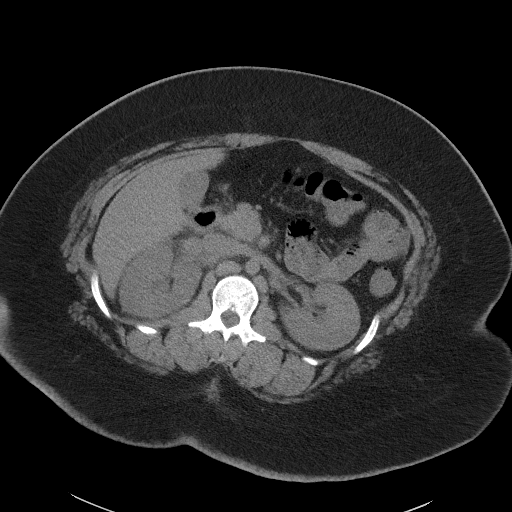
[im 75/93  soft-tissue]
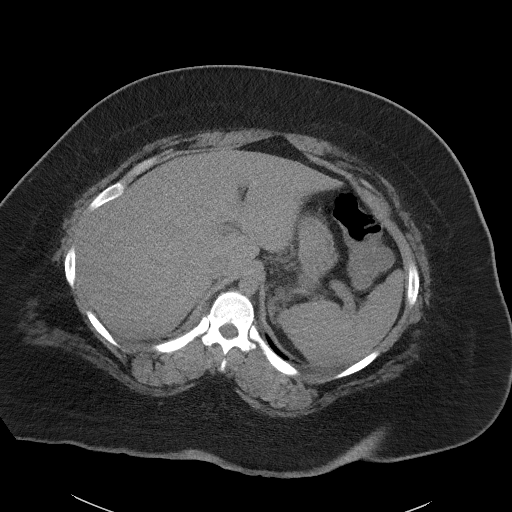
[im 81/93  soft-tissue]
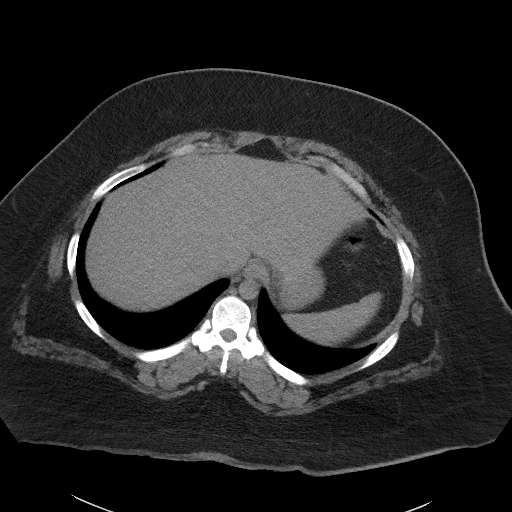
[im 87/93  soft-tissue]
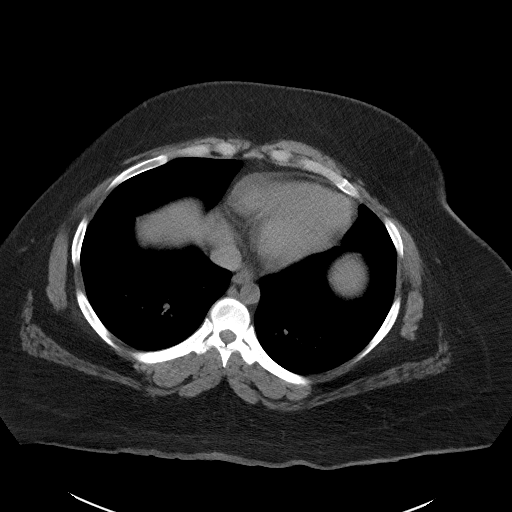

[Series 5: coronal st · coronal · 0.78mm/px · 3 of 101 slices shown]
[im 34/101  soft-tissue]
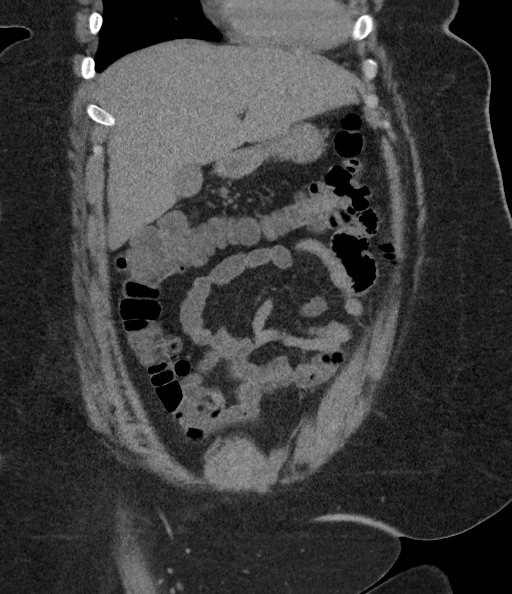
[im 45/101  soft-tissue]
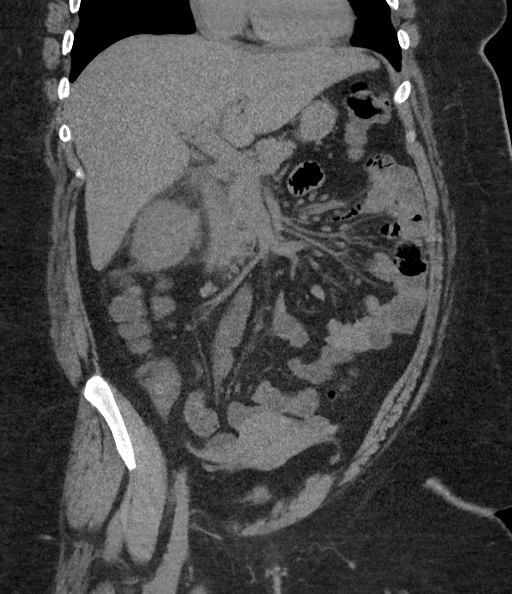
[im 56/101  soft-tissue]
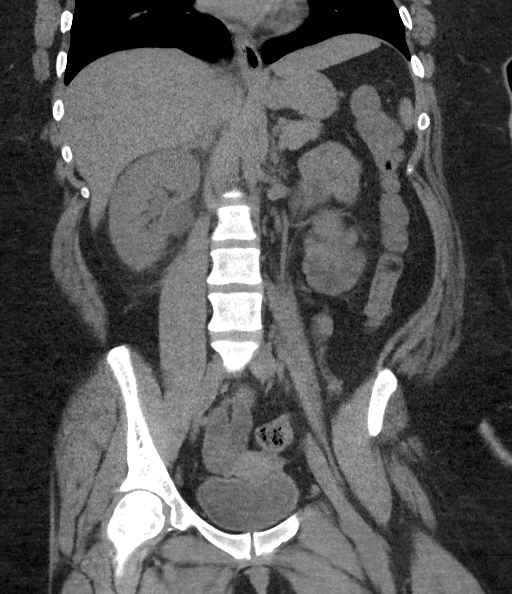

[16 of 46 positions shown; findings below may reference images not displayed]

FINDINGS: Lower chest: The visualized lung bases are clear. The visualized
heart and pericardium are unremarkable.

Hepatobiliary: No focal liver abnormality is seen. No gallstones,
gallbladder wall thickening, or biliary dilatation.

Pancreas: Unremarkable

Spleen: Unremarkable

Adrenals/Urinary Tract: The adrenal glands are unremarkable. The
kidneys are normal in size and position. There is mild right
hydronephrosis and moderate right perinephric and parapelvic
inflammatory stranding. Distal to the right ureteropelvic junction,
the ureter appears decompressed. No nephro or urolithiasis
identified. Together, the findings could reflect the sequela of a
recently passed calculus, an inflammatory or infectious process
involving the right kidney such as pyelonephritis. The bladder is
unremarkable.

Stomach/Bowel: Stomach, small bowel, and large bowel are
unremarkable. Appendix normal. No free intraperitoneal gas or fluid.

Vascular/Lymphatic: The abdominal vasculature is unremarkable on
this noncontrast examination. There is shotty right pararenal and
aortocaval lymphadenopathy, likely reactive in nature. No frankly
pathologic adenopathy within the abdomen and pelvis.

Reproductive: Uterus and bilateral adnexa are unremarkable.

Other: Tiny fat containing umbilical hernia.  Rectum unremarkable.

Musculoskeletal: No acute or significant osseous findings.
IMPRESSION: Moderate right perinephric inflammatory stranding, mild right
hydronephrosis, and shotty pararenal and retroperitoneal adenopathy
most in keeping with a unilateral inflammatory process, most
commonly pyelonephritis. A recently passed calculus could result in
a similar appearance, though the normal appearance of the distal
right ureter and lack of additional nephrolithiasis makes this less
likely.

## 2022-02-15 ENCOUNTER — Emergency Department (HOSPITAL_COMMUNITY)
Admission: EM | Admit: 2022-02-15 | Discharge: 2022-02-15 | Disposition: A | Discharge status: Home / Self Care | Payer: Medicaid Other | Attending: Emergency Medicine | Admitting: Emergency Medicine

## 2022-02-15 ENCOUNTER — Encounter (HOSPITAL_COMMUNITY): Payer: Self-pay

## 2022-02-15 DIAGNOSIS — H60391 Other infective otitis externa, right ear: Secondary | ICD-10-CM | POA: Insufficient documentation

## 2022-02-15 DIAGNOSIS — H66001 Acute suppurative otitis media without spontaneous rupture of ear drum, right ear: Secondary | ICD-10-CM

## 2022-02-15 DIAGNOSIS — H9201 Otalgia, right ear: Secondary | ICD-10-CM

## 2022-02-15 MED ORDER — CIPROFLOXACIN-DEXAMETHASONE 0.3-0.1 % OT SUSP
4.0000 [drp] | OTIC | Status: AC
  Administered 2022-02-15: 4 [drp] via OTIC
  Filled 2022-02-15: qty 7.5

## 2022-02-15 MED ORDER — CIPROFLOXACIN-DEXAMETHASONE 0.3-0.1 % OT SUSP: 4.0000 [drp] | Freq: Two times a day (BID) | OTIC | 0 refills | Status: AC

## 2022-02-15 MED ORDER — AMOXICILLIN-POT CLAVULANATE 875-125 MG PO TABS: 1.0000 | ORAL_TABLET | Freq: Two times a day (BID) | ORAL | 0 refills | Status: AC
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
## 2022-02-15 NOTE — ED Triage Notes (Signed)
Pt states she has a right ear infection x 4 days. Pt states the pain has spread down her face to her throat.

## 2022-02-15 NOTE — Discharge Instructions (Addendum)
Today you were seen in the emergency department for your ear infection.    In the emergency department you were given eardrops.    At home, please continue the eardrops and oral antibiotics.    Follow-up with your primary doctor in 2-3 days regarding your visit.  You may also schedule follow-up with the doctors listed on this packet.  Return immediately to the emergency department if you experience any of the following: Worsening pain, hearing loss, fevers, neck stiffness, or any other concerning symptoms.    Thank you for visiting our Emergency Department. It was a pleasure taking care of you today.

## 2022-07-01 ENCOUNTER — Encounter (HOSPITAL_COMMUNITY): Payer: Self-pay

## 2022-07-01 ENCOUNTER — Emergency Department (HOSPITAL_COMMUNITY)
Admission: EM | Admit: 2022-07-01 | Discharge: 2022-07-01 | Disposition: A | Discharge status: Home / Self Care | Payer: Self-pay | Attending: Emergency Medicine | Admitting: Emergency Medicine

## 2022-07-01 ENCOUNTER — Other Ambulatory Visit: Payer: Self-pay

## 2022-07-01 DIAGNOSIS — Z1152 Encounter for screening for COVID-19: Secondary | ICD-10-CM | POA: Insufficient documentation

## 2022-07-01 DIAGNOSIS — J101 Influenza due to other identified influenza virus with other respiratory manifestations: Secondary | ICD-10-CM | POA: Insufficient documentation

## 2022-07-01 DIAGNOSIS — Z87891 Personal history of nicotine dependence: Secondary | ICD-10-CM | POA: Insufficient documentation

## 2022-07-01 DIAGNOSIS — M545 Low back pain, unspecified: Secondary | ICD-10-CM | POA: Insufficient documentation

## 2022-07-01 LAB — RESP PANEL BY RT-PCR (RSV, FLU A&B, COVID)  RVPGX2
Influenza A by PCR: POSITIVE — AB
Influenza B by PCR: NEGATIVE
Resp Syncytial Virus by PCR: NEGATIVE
SARS Coronavirus 2 by RT PCR: NEGATIVE

## 2022-07-01 LAB — URINALYSIS, ROUTINE W REFLEX MICROSCOPIC
Bilirubin Urine: NEGATIVE
Glucose, UA: NEGATIVE mg/dL
Hgb urine dipstick: NEGATIVE
Ketones, ur: 5 mg/dL — AB
Leukocytes,Ua: NEGATIVE
Nitrite: NEGATIVE
Protein, ur: NEGATIVE mg/dL
Specific Gravity, Urine: 1.027 (ref 1.005–1.030)
pH: 7 (ref 5.0–8.0)

## 2022-07-01 LAB — PREGNANCY, URINE: Preg Test, Ur: NEGATIVE

## 2022-07-01 LAB — GROUP A STREP BY PCR: Group A Strep by PCR: NOT DETECTED

## 2022-07-01 MED ORDER — IBUPROFEN 400 MG PO TABS
600.0000 mg | ORAL_TABLET | Freq: Once | ORAL | Status: AC
  Administered 2022-07-01: 600 mg via ORAL
  Filled 2022-07-01: qty 2

## 2022-07-01 NOTE — ED Triage Notes (Signed)
Pt c/o cough, back pain, and sore throat x 2 days.  Back pain worse with coughing.

## 2022-07-01 NOTE — Discharge Instructions (Addendum)
Note that your symptoms are most likely secondary to influenza A.  Your urine was without infection as well as pregnancy test was negative..  As discussed, recommend Tylenol/Motrin as needed for pain/fever, daily antihistamine/allergy medicine in the form of Claritin/Zyrtec/Allegra, nasal steroid spray for congestion such as Flonase/Nasacort.  Recommend follow-up with primary care in 5 to 7 days for reevaluation of symptoms.  Please not hesitate to return to emergency department if the worrisome signs and symptoms we discussed become apparent.

## 2022-07-01 NOTE — ED Provider Notes (Signed)
[facility] EMERGENCY DEPARTMENT Provider Note   CSN: [id number] Arrival date & time: 07/01/22  4627     History  Chief Complaint  Patient presents with   Cough    Alexandria Lara is a 27 y.o. female.   Cough   27 year old female presents emergency department with complaints of cough, congestion, sore throat, back pain.  Patient reports history of symptoms beginning approximately 3 to 4 days ago.  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  States she is taken some at home over-the-counter cold and flu medicine of which has helped some.  Reports back pain is associated with coughing that is worse with coughing and movement.  Denies saddle anesthesia, weakness/sensory deficits in lower extremities, bowel/bladder dysfunction, history of IV drug use denies fever, chills, night sweats, chest pain, abdominal pain, nausea, vomiting, urinary/vaginal symptoms, change in bowel habits.  Past medical history significant for ADHD, obesity, depression  Home Medications Prior to Admission medications   Medication Sig Start Date End Date Taking? Authorizing Provider  acetaminophen (TYLENOL) 325 MG tablet Take 2 tablets (650 mg total) by mouth every 6 (six) hours as needed for mild pain, fever or headache (or Fever >/= 101). 09/19/20  Yes Roxan Hockey, MD      Allergies    Patient has no known allergies.    Review of Systems   Review of Systems  Respiratory:  Positive for cough.   All other systems reviewed and are negative.   Physical Exam Updated Vital Signs BP 123/76 (BP Location: Right Arm)   Pulse 84   Temp 98.7 F (37.1 C) (Oral)   Resp 18   Ht 5\' 5"  (1.651 m)   Wt 129.7 kg   LMP 06/20/2022   SpO2 98%   BMI 47.59 kg/m  Physical Exam Vitals and nursing note reviewed.  Constitutional:      General: She is not in acute distress.    Appearance: She is well-developed.  HENT:     Head: Normocephalic and atraumatic.      Nose: Congestion present.     Mouth/Throat:     Pharynx: Posterior oropharyngeal erythema present.     Comments: Posterior pharyngeal erythema noted.  Tonsils 01+ bilaterally with no obvious exudate.  Uvula midline rise symmetrical phonation.  No sublingual or submandibular swelling appreciated. Eyes:     Conjunctiva/sclera: Conjunctivae normal.  Cardiovascular:     Rate and Rhythm: Normal rate and regular rhythm.     Heart sounds: No murmur heard. Pulmonary:     Effort: Pulmonary effort is normal. No respiratory distress.     Breath sounds: Normal breath sounds. No wheezing, rhonchi or rales.  Abdominal:     Palpations: Abdomen is soft.     Tenderness: There is no abdominal tenderness. There is no guarding.  Musculoskeletal:        General: No swelling.     Cervical back: Neck supple.     Comments: Strength symmetric bilateral lower extremities.  No sensory deficits along major nerve distributions of lower extremities.  DTR symmetric and equal bilateral lower extremities.  Patient with mild tender to palpation paraspinally in the lumbar region bilaterally.  Skin:    General: Skin is warm and dry.     Capillary Refill: Capillary refill takes less than 2 seconds.  Neurological:     Mental Status: She is alert.  Psychiatric:        Mood and Affect: Mood normal.  ED Results / Procedures / Treatments   Labs (all labs ordered are listed, but only abnormal results are displayed) Labs Reviewed  RESP PANEL BY RT-PCR (RSV, FLU A&B, COVID)  RVPGX2 - Abnormal; Notable for the following components:      Result Value   Influenza A by PCR POSITIVE (*)    All other components within normal limits  URINALYSIS, ROUTINE W REFLEX MICROSCOPIC - Abnormal; Notable for the following components:   Ketones, ur 5 (*)    All other components within normal limits  GROUP A STREP BY PCR  PREGNANCY, URINE    EKG None  Radiology No results found.  Procedures Procedures    Medications Ordered  in ED Medications  ibuprofen (ADVIL) tablet 600 mg (600 mg Oral Given 07/01/22 1229)    ED Course/ Medical Decision Making/ A&P                           Medical Decision Making Amount and/or Complexity of Data Reviewed Labs: ordered.  Risk Prescription drug management.   This patient presents to the ED for concern of flulike symptoms, this involves an extensive number of treatment options, and is a complaint that carries with it a high risk of complications and morbidity.  The differential diagnosis includes influenza, COVID, RSV, pneumonia, meningitis, sepsis   Co morbidities that complicate the patient evaluation  See HPI   Additional history obtained:  Additional history obtained from EMR External records from outside source obtained and reviewed including hospital records   Lab Tests:  I Ordered, and personally interpreted labs.  The pertinent results include: Respiratory viral panel positive for influenza A.  Group A strep negative.  UA significant for ketones but otherwise unremarkable.  Urine pregnancy negative.   Imaging Studies ordered:  N/a   Cardiac Monitoring: / EKG:  The patient was maintained on a cardiac monitor.  I personally viewed and interpreted the cardiac monitored which showed an underlying rhythm of: Sinus rhythm   Consultations Obtained:  N/a    Problem List / ED Course / Critical interventions / Medication management  Influenza B I ordered medication including Advil for pain   Reevaluation of the patient after these medicines showed that the patient improved I have reviewed the patients home medicines and have made adjustments as needed   Social Determinants of Health:  Former cigarette use.  Denies illicit drug use.   Test / Admission - Considered:  Influenza B Vitals signs within normal range and stable throughout visit. Laboratory/imaging studies significant for: See above Patient symptoms most likely secondary to  influenza B.  No evidence of acute respiratory distress or hypoxia.  Patient afebrile.  Patient's other studies were relatively reassuring.  Patient recommended symptomatic therapy at home as indicated in discharge instructions.  Recommend follow-up with primary care for reassessment.  Treatment plan discussed at length with patient and she acknowledged understanding was agreeable to said plan. Worrisome signs and symptoms were discussed with the patient, and the patient acknowledged understanding to return to the ED if noticed. Patient was stable upon discharge.        Final Clinical Impression(s) / ED Diagnoses Final diagnoses:  Influenza A    Rx / DC Orders ED Discharge Orders     None         Wilnette Kales, [place] 07/01/22 [place], [place], DO 07/02/22 [phone]

## 2023-02-15 ENCOUNTER — Telehealth: Payer: Self-pay

## 2023-02-15 NOTE — Telephone Encounter (Signed)
LVM - Asking pt to call back, informed pt that there is a Nurse, children's for [facility] residents that can provide medical attention
